# Patient Record
Sex: Male | Born: 1935 | Race: White | Hispanic: No | Marital: Single | Smoking: Former smoker
Health system: Southern US, Community
[De-identification: ages and names within clinical notes are randomized; demographics above are authoritative.]

## PROBLEM LIST (undated history)

## (undated) DIAGNOSIS — J392 Other diseases of pharynx: Secondary | ICD-10-CM

## (undated) DIAGNOSIS — I10 Essential (primary) hypertension: Secondary | ICD-10-CM

## (undated) HISTORY — PX: REPLACEMENT TOTAL KNEE: SUR1224

## (undated) HISTORY — DX: Other diseases of pharynx: J39.2

## (undated) HISTORY — PX: BACK SURGERY: SHX140

---

## 2006-12-14 ENCOUNTER — Other Ambulatory Visit: Payer: Self-pay

## 2006-12-14 ENCOUNTER — Emergency Department: Payer: Self-pay | Admitting: Emergency Medicine

## 2006-12-25 ENCOUNTER — Ambulatory Visit: Payer: Self-pay | Admitting: Internal Medicine

## 2008-11-27 IMAGING — CT CT ANGIO AORTA RUNOFF
1 series · 14 of 16 positions shown, 18 images · IV contrast (APPLIED)
Comparison: none

REASON FOR EXAM: dislocated left knee
COMMENTS:

[Series 5: angiorunoff 5.0 b30f · axial · 0.92mm/px · z∈[-478,+732]mm · 14 of 280 slices shown, 18 images]
[im 19/280  soft-tissue]
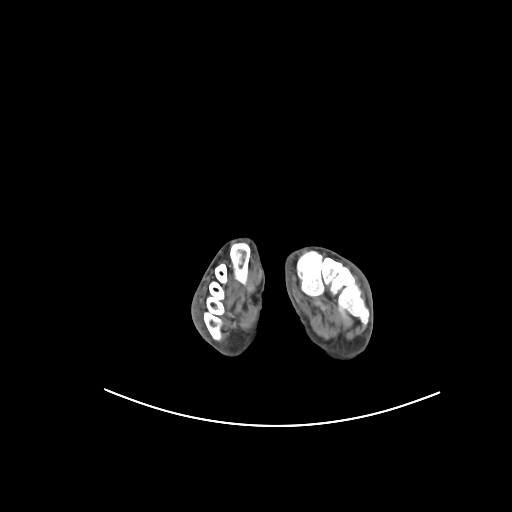
[im 19/280  bone]
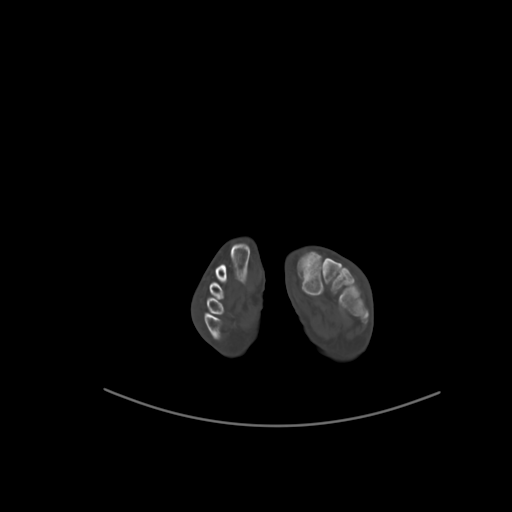
[im 38/280  soft-tissue]
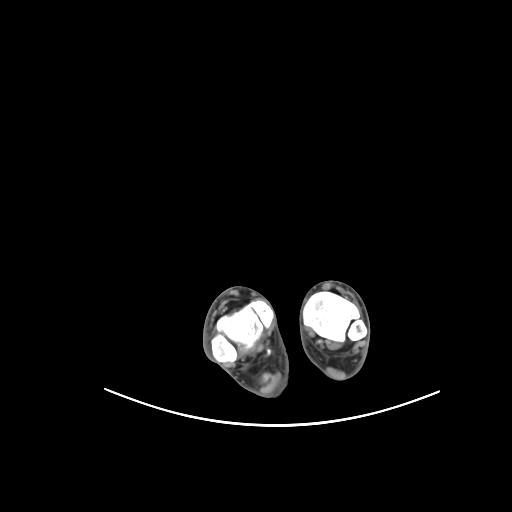
[im 56/280  soft-tissue]
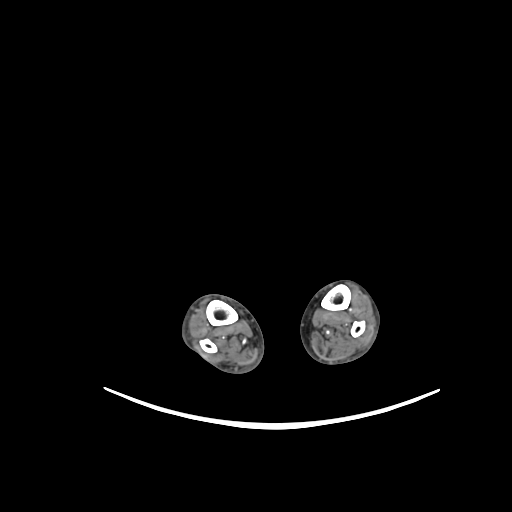
[im 75/280  soft-tissue]
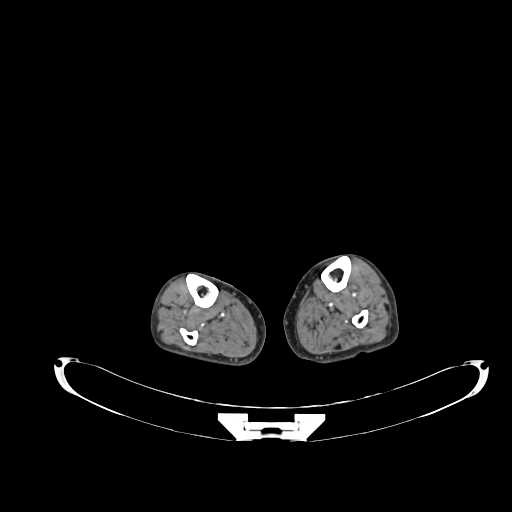
[im 94/280  soft-tissue]
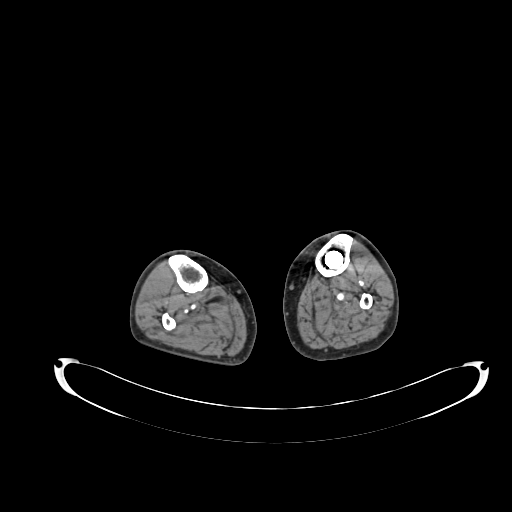
[im 94/280  bone]
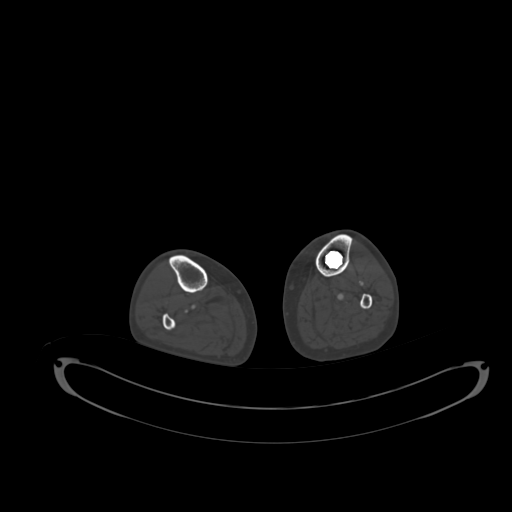
[im 112/280  soft-tissue]
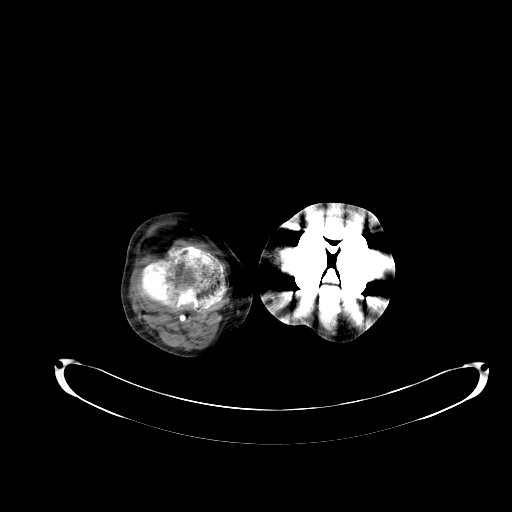
[im 131/280  soft-tissue]
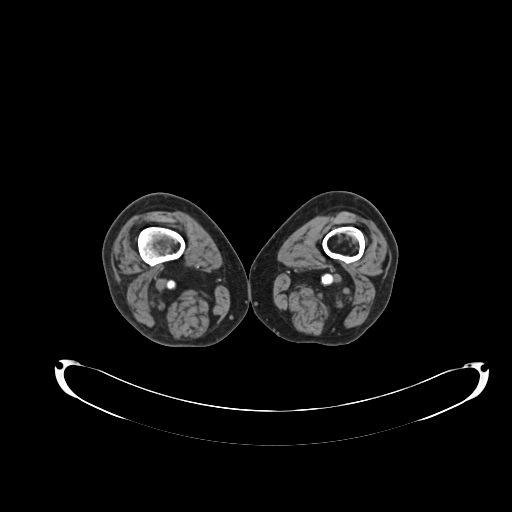
[im 149/280  soft-tissue]
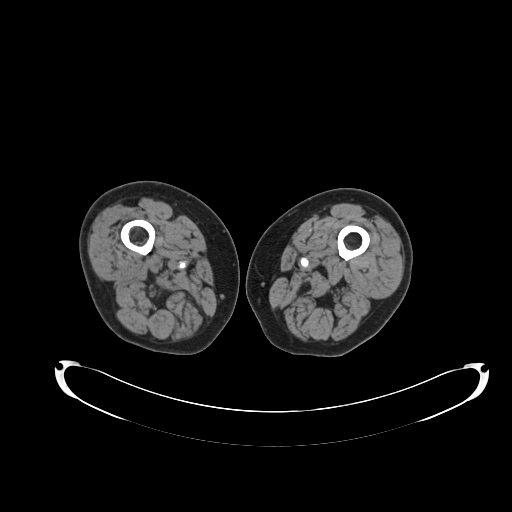
[im 168/280  soft-tissue]
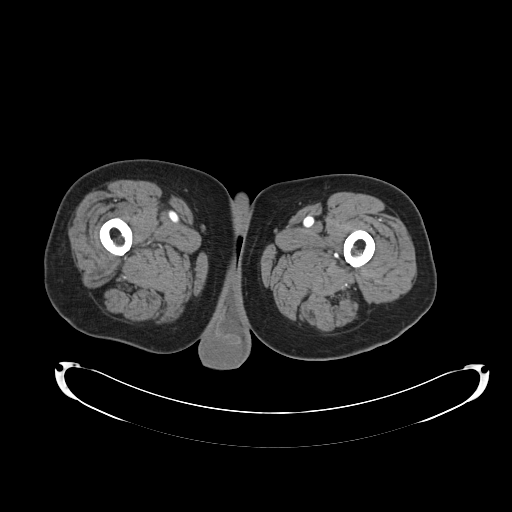
[im 168/280  bone]
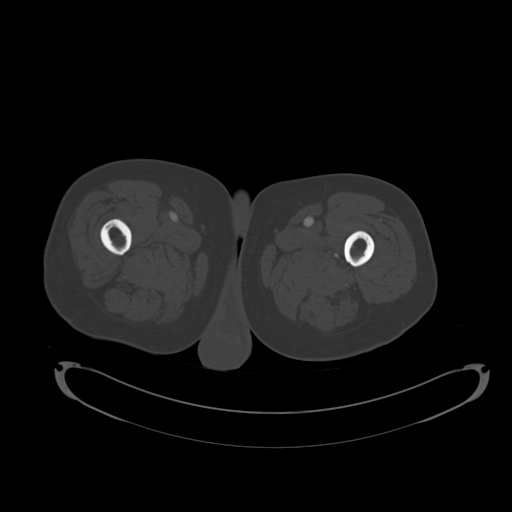
[im 187/280  soft-tissue]
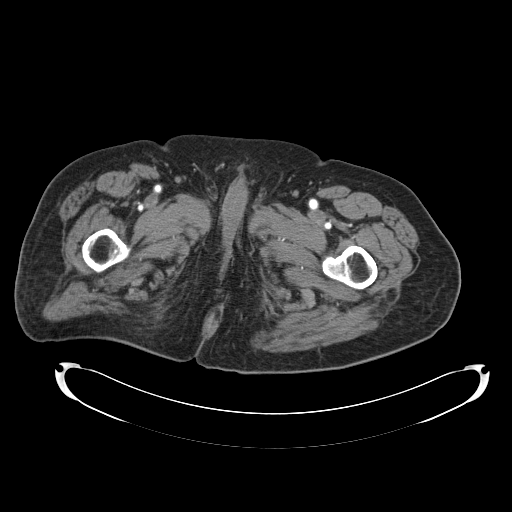
[im 205/280  soft-tissue]
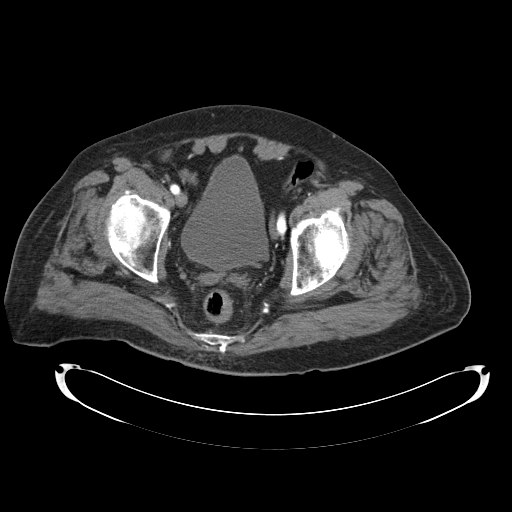
[im 224/280  soft-tissue]
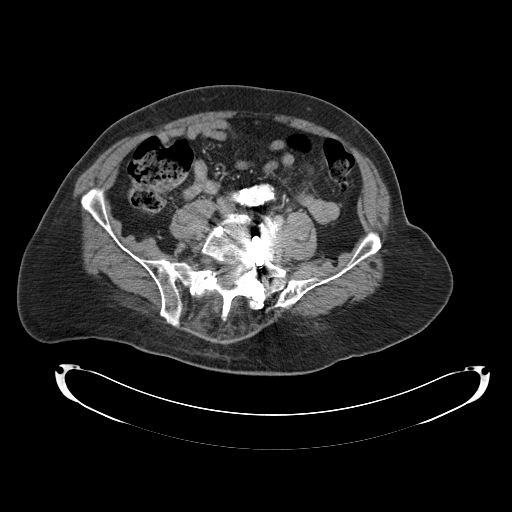
[im 242/280  soft-tissue]
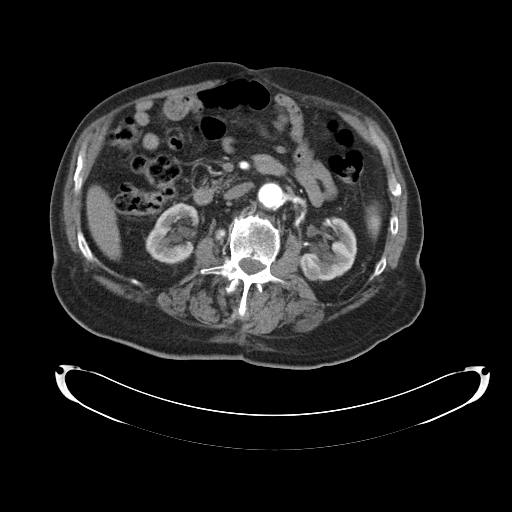
[im 242/280  bone]
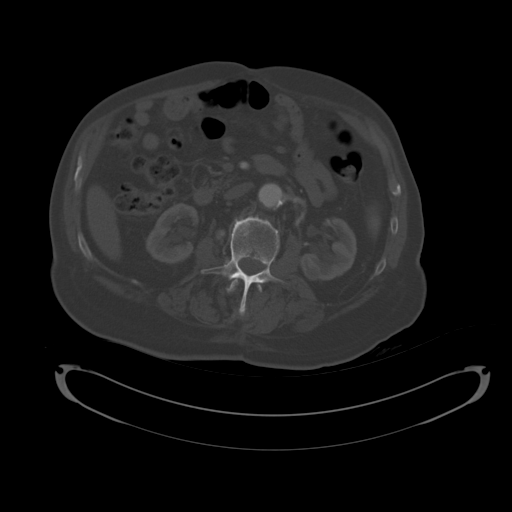
[im 261/280  soft-tissue]
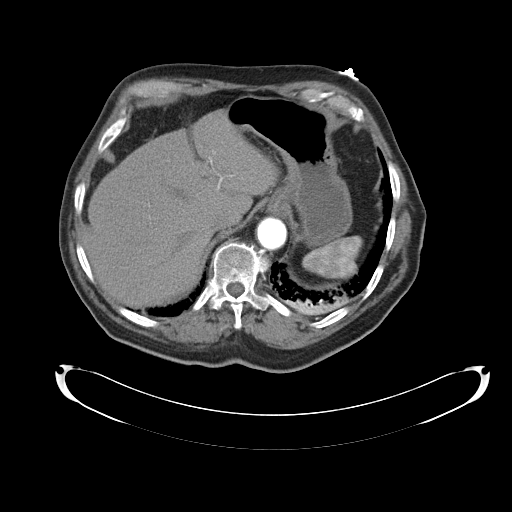

[14 of 16 positions shown; findings below may reference images not displayed]

PROCEDURE:     CT  - CT ANGIOGRAPHY AORTA RUNOFF  - December 14, 2006 [DATE]

RESULT:     CT angiogram was performed status post intravenous
administration of 125 ml of Isovue 370.  Included are axial source images,
coronal and sagittal source images, coronal and sagittal maximum intensity
projections, and 3D reconstructions.

CT angiogram of the liver, spleen, pancreas and adrenals are within normal
limits. Evaluation of the kidneys demonstrates what appear to be bilateral
peripelvic cysts.

There is no evidence of an abdominal aortic aneurysm. Coarse mural
calcifications are demonstrated within the aorta and iliac vessels. There is
no evidence of aneurysmal dilatation of the iliac vessels. The origins of
the iliac vessels are partially obscured secondary to metallic streak
artifact due to lumbar spine hardware. The vessels within the upper portions
of the lower extremities appear to be intact. There does appear to be
evidence of mild diffuse atherosclerotic disease. No focal outpouchings are
appreciated. Evaluation of the LEFT lower extremity demonstrates severe
streak artifact in the region of the knee which obscure visualization of the
majority of the popliteal vessel. Below this level the trifurcation vessels
demonstrate diffuse atherosclerotic disease though appear to be patent
except for the mid to distal portion of the peroneus artery secondary to
focal areas of complete stenosis.

Evaluation of the vessels within the RIGHT lower extremity demonstrate
patency of the internal and superficial femoral arteries though there is
diffuse moderate to severe atherosclerotic disease within the superficial
femoral artery. The popliteal vessel appears be patent. The trifurcation
vessels demonstrate diffuse atherosclerotic disease with areas of high-grade
stenosis within the trifurcation vessels.
IMPRESSION: 1.The visualized portions of the vessels demonstrate no evidence of contrast
extravasation or pseudoaneurysm. The LEFT popliteal arteries are not
visualized secondary to a large amount of steak artifact due to metallic
hardware.

Dr. Nurbazilah of the Emergency Room was informed of these findings via a
preliminary report.

## 2009-06-12 ENCOUNTER — Emergency Department: Payer: Self-pay | Admitting: Emergency Medicine

## 2009-11-08 ENCOUNTER — Ambulatory Visit: Payer: Self-pay | Admitting: Internal Medicine

## 2013-11-25 ENCOUNTER — Emergency Department: Payer: Self-pay | Admitting: Emergency Medicine

## 2013-11-25 LAB — URINALYSIS, COMPLETE
Bacteria: NONE SEEN
Bilirubin,UR: NEGATIVE
Blood: NEGATIVE
Glucose,UR: NEGATIVE mg/dL (ref 0–75)
KETONE: NEGATIVE
Leukocyte Esterase: NEGATIVE
Nitrite: NEGATIVE
PROTEIN: NEGATIVE
Ph: 5 (ref 4.5–8.0)
Specific Gravity: 1.024 (ref 1.003–1.030)
Squamous Epithelial: NONE SEEN

## 2013-11-25 LAB — CBC
HCT: 41.4 % (ref 40.0–52.0)
HGB: 13.6 g/dL (ref 13.0–18.0)
MCH: 29.6 pg (ref 26.0–34.0)
MCHC: 32.8 g/dL (ref 32.0–36.0)
MCV: 90 fL (ref 80–100)
Platelet: 189 10*3/uL (ref 150–440)
RBC: 4.59 10*6/uL (ref 4.40–5.90)
RDW: 14.1 % (ref 11.5–14.5)
WBC: 6.3 10*3/uL (ref 3.8–10.6)

## 2013-11-25 LAB — TROPONIN I
TROPONIN-I: 0.03 ng/mL
TROPONIN-I: 0.03 ng/mL

## 2013-11-25 LAB — COMPREHENSIVE METABOLIC PANEL
AST: 14 U/L — AB (ref 15–37)
Albumin: 3.5 g/dL (ref 3.4–5.0)
Alkaline Phosphatase: 93 U/L
Anion Gap: 4 — ABNORMAL LOW (ref 7–16)
BILIRUBIN TOTAL: 0.4 mg/dL (ref 0.2–1.0)
BUN: 24 mg/dL — AB (ref 7–18)
CALCIUM: 8.5 mg/dL (ref 8.5–10.1)
Chloride: 108 mmol/L — ABNORMAL HIGH (ref 98–107)
Co2: 28 mmol/L (ref 21–32)
Creatinine: 1.19 mg/dL (ref 0.60–1.30)
EGFR (African American): 60 — ABNORMAL LOW
EGFR (Non-African Amer.): 52 — ABNORMAL LOW
Glucose: 117 mg/dL — ABNORMAL HIGH (ref 65–99)
Osmolality: 284 (ref 275–301)
Potassium: 4.8 mmol/L (ref 3.5–5.1)
SGPT (ALT): 9 U/L — ABNORMAL LOW (ref 12–78)
Sodium: 140 mmol/L (ref 136–145)
Total Protein: 7.3 g/dL (ref 6.4–8.2)

## 2013-11-25 LAB — LIPASE, BLOOD: LIPASE: 85 U/L (ref 73–393)

## 2013-11-25 LAB — PHENYTOIN LEVEL, TOTAL: Dilantin: 1.7 ug/mL — ABNORMAL LOW (ref 10.0–20.0)

## 2013-11-26 ENCOUNTER — Emergency Department: Payer: Self-pay | Admitting: Emergency Medicine

## 2013-11-26 LAB — COMPREHENSIVE METABOLIC PANEL
ALBUMIN: 3.7 g/dL (ref 3.4–5.0)
ALT: 22 U/L (ref 12–78)
Alkaline Phosphatase: 101 U/L
Anion Gap: 6 — ABNORMAL LOW (ref 7–16)
BILIRUBIN TOTAL: 0.4 mg/dL (ref 0.2–1.0)
BUN: 21 mg/dL — AB (ref 7–18)
CO2: 26 mmol/L (ref 21–32)
Calcium, Total: 8.9 mg/dL (ref 8.5–10.1)
Chloride: 105 mmol/L (ref 98–107)
Creatinine: 0.99 mg/dL (ref 0.60–1.30)
EGFR (African American): >60
EGFR (Non-African Amer.): >60
GLUCOSE: 124 mg/dL — AB (ref 65–99)
OSMOLALITY: 278 (ref 275–301)
Potassium: 5 mmol/L (ref 3.5–5.1)
SGOT(AST): 8 U/L — ABNORMAL LOW (ref 15–37)
SODIUM: 137 mmol/L (ref 136–145)
Total Protein: 8 g/dL (ref 6.4–8.2)

## 2013-11-26 LAB — CBC
HCT: 41.1 % (ref 40.0–52.0)
HGB: 13.4 g/dL (ref 13.0–18.0)
MCH: 29.3 pg (ref 26.0–34.0)
MCHC: 32.6 g/dL (ref 32.0–36.0)
MCV: 90 fL (ref 80–100)
Platelet: 197 10*3/uL (ref 150–440)
RBC: 4.57 10*6/uL (ref 4.40–5.90)
RDW: 13.9 % (ref 11.5–14.5)
WBC: 6.9 10*3/uL (ref 3.8–10.6)

## 2013-11-26 LAB — LIPASE, BLOOD: Lipase: 73 U/L (ref 73–393)

## 2014-03-23 ENCOUNTER — Ambulatory Visit: Payer: Self-pay | Admitting: Oncology

## 2014-03-23 LAB — COMPREHENSIVE METABOLIC PANEL
ALT: 17 U/L
Albumin: 3.4 g/dL (ref 3.4–5.0)
Alkaline Phosphatase: 101 U/L
Anion Gap: 6 — ABNORMAL LOW (ref 7–16)
BILIRUBIN TOTAL: 0.4 mg/dL (ref 0.2–1.0)
BUN: 28 mg/dL — ABNORMAL HIGH (ref 7–18)
CO2: 28 mmol/L (ref 21–32)
CREATININE: 1.26 mg/dL (ref 0.60–1.30)
Calcium, Total: 8.9 mg/dL (ref 8.5–10.1)
Chloride: 105 mmol/L (ref 98–107)
EGFR (African American): >60
GFR CALC NON AF AMER: 57 — AB
GLUCOSE: 104 mg/dL — AB (ref 65–99)
Osmolality: 283 (ref 275–301)
Potassium: 5.6 mmol/L — ABNORMAL HIGH (ref 3.5–5.1)
SGOT(AST): 13 U/L — ABNORMAL LOW (ref 15–37)
SODIUM: 139 mmol/L (ref 136–145)
Total Protein: 6.9 g/dL (ref 6.4–8.2)

## 2014-03-23 LAB — CBC CANCER CENTER
Basophil #: 0.1 x10 3/mm (ref 0.0–0.1)
Basophil %: 0.8 %
EOS ABS: 0.2 x10 3/mm (ref 0.0–0.7)
Eosinophil %: 3.8 %
HCT: 39.2 % — AB (ref 40.0–52.0)
HGB: 12.9 g/dL — AB (ref 13.0–18.0)
LYMPHS PCT: 22.5 %
Lymphocyte #: 1.5 x10 3/mm (ref 1.0–3.6)
MCH: 29.3 pg (ref 26.0–34.0)
MCHC: 33 g/dL (ref 32.0–36.0)
MCV: 89 fL (ref 80–100)
MONO ABS: 0.6 x10 3/mm (ref 0.2–1.0)
MONOS PCT: 9.4 %
Neutrophil #: 4.1 x10 3/mm (ref 1.4–6.5)
Neutrophil %: 63.5 %
Platelet: 206 x10 3/mm (ref 150–440)
RBC: 4.4 10*6/uL (ref 4.40–5.90)
RDW: 14.6 % — ABNORMAL HIGH (ref 11.5–14.5)
WBC: 6.5 x10 3/mm (ref 3.8–10.6)

## 2014-03-23 LAB — LACTATE DEHYDROGENASE: LDH: 162 U/L (ref 85–241)

## 2014-04-05 ENCOUNTER — Ambulatory Visit: Payer: Self-pay | Admitting: Otolaryngology

## 2014-04-06 ENCOUNTER — Ambulatory Visit: Payer: Self-pay | Admitting: Oncology

## 2014-04-13 ENCOUNTER — Ambulatory Visit: Payer: Self-pay | Admitting: Oncology

## 2014-04-19 ENCOUNTER — Emergency Department: Payer: Self-pay | Admitting: Emergency Medicine

## 2014-04-19 LAB — CBC WITH DIFFERENTIAL/PLATELET
BASOS ABS: 0 10*3/uL (ref 0.0–0.1)
BASOS PCT: 0.7 %
EOS ABS: 0.1 10*3/uL (ref 0.0–0.7)
EOS PCT: 1.5 %
HCT: 39.4 % — ABNORMAL LOW (ref 40.0–52.0)
HGB: 12.9 g/dL — ABNORMAL LOW (ref 13.0–18.0)
LYMPHS ABS: 0.7 10*3/uL — AB (ref 1.0–3.6)
Lymphocyte %: 14.5 %
MCH: 29.7 pg (ref 26.0–34.0)
MCHC: 32.7 g/dL (ref 32.0–36.0)
MCV: 91 fL (ref 80–100)
MONOS PCT: 7.3 %
Monocyte #: 0.4 x10 3/mm (ref 0.2–1.0)
NEUTROS PCT: 76 %
Neutrophil #: 3.7 10*3/uL (ref 1.4–6.5)
PLATELETS: 186 10*3/uL (ref 150–440)
RBC: 4.33 10*6/uL — AB (ref 4.40–5.90)
RDW: 14.2 % (ref 11.5–14.5)
WBC: 4.9 10*3/uL (ref 3.8–10.6)

## 2014-04-19 LAB — COMPREHENSIVE METABOLIC PANEL
Albumin: 3.4 g/dL (ref 3.4–5.0)
Alkaline Phosphatase: 92 U/L
Anion Gap: 9 (ref 7–16)
BUN: 23 mg/dL — ABNORMAL HIGH (ref 7–18)
Bilirubin,Total: 0.4 mg/dL (ref 0.2–1.0)
CO2: 27 mmol/L (ref 21–32)
Calcium, Total: 8.3 mg/dL — ABNORMAL LOW (ref 8.5–10.1)
Chloride: 105 mmol/L (ref 98–107)
Creatinine: 1.1 mg/dL (ref 0.60–1.30)
EGFR (African American): >60
EGFR (Non-African Amer.): >60
Glucose: 107 mg/dL — ABNORMAL HIGH (ref 65–99)
Osmolality: 285 (ref 275–301)
POTASSIUM: 4.6 mmol/L (ref 3.5–5.1)
SGOT(AST): 16 U/L (ref 15–37)
SGPT (ALT): 12 U/L — ABNORMAL LOW
SODIUM: 141 mmol/L (ref 136–145)
TOTAL PROTEIN: 6.7 g/dL (ref 6.4–8.2)

## 2014-04-19 LAB — URINALYSIS, COMPLETE
Bacteria: NONE SEEN
Bilirubin,UR: NEGATIVE
Blood: NEGATIVE
Glucose,UR: NEGATIVE mg/dL (ref 0–75)
Hyaline Cast: 2
Ketone: NEGATIVE
Leukocyte Esterase: NEGATIVE
Nitrite: NEGATIVE
Ph: 5 (ref 4.5–8.0)
Protein: NEGATIVE
SPECIFIC GRAVITY: 1.011 (ref 1.003–1.030)
Squamous Epithelial: NONE SEEN

## 2014-04-19 LAB — TROPONIN I

## 2014-04-19 LAB — LIPASE, BLOOD: LIPASE: 38 U/L — AB (ref 73–393)

## 2014-10-08 ENCOUNTER — Other Ambulatory Visit: Payer: Self-pay | Admitting: *Deleted

## 2014-10-08 DIAGNOSIS — C8590 Non-Hodgkin lymphoma, unspecified, unspecified site: Secondary | ICD-10-CM

## 2014-10-12 ENCOUNTER — Inpatient Hospital Stay: Payer: Medicare Other | Attending: Oncology

## 2014-10-12 ENCOUNTER — Inpatient Hospital Stay (HOSPITAL_BASED_OUTPATIENT_CLINIC_OR_DEPARTMENT_OTHER): Payer: Medicare Other | Admitting: Oncology

## 2014-10-12 VITALS — BP 149/87 | HR 72 | Temp 97.0°F | Resp 18

## 2014-10-12 DIAGNOSIS — I10 Essential (primary) hypertension: Secondary | ICD-10-CM

## 2014-10-12 DIAGNOSIS — D72820 Lymphocytosis (symptomatic): Secondary | ICD-10-CM | POA: Diagnosis not present

## 2014-10-12 DIAGNOSIS — Z79899 Other long term (current) drug therapy: Secondary | ICD-10-CM | POA: Insufficient documentation

## 2014-10-12 DIAGNOSIS — R220 Localized swelling, mass and lump, head: Secondary | ICD-10-CM | POA: Insufficient documentation

## 2014-10-12 DIAGNOSIS — E785 Hyperlipidemia, unspecified: Secondary | ICD-10-CM | POA: Insufficient documentation

## 2014-10-12 DIAGNOSIS — M1990 Unspecified osteoarthritis, unspecified site: Secondary | ICD-10-CM | POA: Diagnosis not present

## 2014-10-12 DIAGNOSIS — R29898 Other symptoms and signs involving the musculoskeletal system: Secondary | ICD-10-CM | POA: Insufficient documentation

## 2014-10-12 DIAGNOSIS — J392 Other diseases of pharynx: Secondary | ICD-10-CM

## 2014-10-12 DIAGNOSIS — C8590 Non-Hodgkin lymphoma, unspecified, unspecified site: Secondary | ICD-10-CM

## 2014-10-12 LAB — CBC WITH DIFFERENTIAL/PLATELET
BASOS ABS: 0.1 10*3/uL (ref 0–0.1)
Basophils Relative: 1 %
EOS ABS: 0.1 10*3/uL (ref 0–0.7)
Eosinophils Relative: 2 %
HEMATOCRIT: 39.2 % — AB (ref 40.0–52.0)
HEMOGLOBIN: 12.8 g/dL — AB (ref 13.0–18.0)
LYMPHS ABS: 1.3 10*3/uL (ref 1.0–3.6)
Lymphocytes Relative: 21 %
MCH: 28.9 pg (ref 26.0–34.0)
MCHC: 32.6 g/dL (ref 32.0–36.0)
MCV: 88.8 fL (ref 80.0–100.0)
Monocytes Absolute: 0.6 10*3/uL (ref 0.2–1.0)
Monocytes Relative: 10 %
NEUTROS ABS: 4.2 10*3/uL (ref 1.4–6.5)
Neutrophils Relative %: 66 %
Platelets: 194 10*3/uL (ref 150–440)
RBC: 4.42 MIL/uL (ref 4.40–5.90)
RDW: 13.5 % (ref 11.5–14.5)
WBC: 6.2 10*3/uL (ref 3.8–10.6)

## 2014-10-12 LAB — COMPREHENSIVE METABOLIC PANEL
ALT: 11 U/L — AB (ref 17–63)
ANION GAP: 5 (ref 5–15)
AST: 16 U/L (ref 15–41)
Albumin: 3.7 g/dL (ref 3.5–5.0)
Alkaline Phosphatase: 85 U/L (ref 38–126)
BUN: 32 mg/dL — AB (ref 6–20)
CALCIUM: 8.2 mg/dL — AB (ref 8.9–10.3)
CO2: 26 mmol/L (ref 22–32)
Chloride: 105 mmol/L (ref 101–111)
Creatinine, Ser: 1.05 mg/dL (ref 0.61–1.24)
GFR calc non Af Amer: 58 mL/min — ABNORMAL LOW (ref >60)
Glucose, Bld: 101 mg/dL — ABNORMAL HIGH (ref 65–99)
Potassium: 5.4 mmol/L — ABNORMAL HIGH (ref 3.5–5.1)
SODIUM: 136 mmol/L (ref 135–145)
Total Bilirubin: 0.6 mg/dL (ref 0.3–1.2)
Total Protein: 6.8 g/dL (ref 6.5–8.1)

## 2014-10-12 LAB — SEDIMENTATION RATE: Sed Rate: 13 mm/hr (ref 0–20)

## 2014-10-13 ENCOUNTER — Encounter: Payer: Self-pay | Admitting: Oncology

## 2014-10-13 DIAGNOSIS — J392 Other diseases of pharynx: Secondary | ICD-10-CM

## 2014-10-13 HISTORY — DX: Other diseases of pharynx: J39.2

## 2014-10-13 NOTE — Progress Notes (Signed)
[place] @ [facility] [phone]  Fax:([phone] [place] OB: January 29, 1917  MR#: [id number]  GMW#:[id number]  Patient Care Team: [name], MD as PCP - General (Internal Medicine)  CHIEF COMPLAINT:  Chief Complaint  Patient presents with  . Follow-up    "swollen mass in my mouth"    Chief Complaint/Diagnosis:   1.right paramedian soft tissue mass, cystic in nature.  Initiate a needle biopsy revealed atypical lymphocytosis raising concerned regarding lymphoma (November, 2015) biopsy was done in Dr. Reola Mosher office 2.PET scan which has been donein November of 2015 was inconclusive  No flowsheet data found.  INTERVAL HISTORY: 81 year old gentleman came today further follow-up regarding nasopharyngeal mass.  Initial biopsy was inconclusive.  Patient does not have any difficulty swallowing.  Since last evaluation nothing has changed in terms of symptoms.  Patient is here for further evaluation.  No chills.  No fever.  No difficulty swallowing.  REVIEW OF SYSTEMS:   Gen. status: In wheelchair.  performance status HEENT: No difficulty swallowing No pain.  Lungs: No shortness of breath.  GI: Nausea no vomiting or diarrhea.  Lower extremity no edema.  Cardiac: No chest pain.  Patient had an emergency room visit since last evaluation.  GI: No nausea no vomiting or diarrhea GU: No dysuria hematuria musculoskeletal system no bony pain skin: No rash  As per HPI. Otherwise, a complete review of systems is negatve.  PAST MEDICAL HISTORY: Past Medical History  Diagnosis Date  . 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  FAMILY HISTORY  Allergies:  No Known Allergies:   Significant History/PMH:   high cholesterol:    htn:    seizures as a child:    ABD:    lumbar:    cervical:    left knee:   Preventive Screening:  Has patient had any of the following test? Colonscopy  Prostate Exam   Last  Colonoscopy: never   Last Prostate Exam: unknown   Smoking History: Smoking History smoked 1/2 pack/day for 10 years; quit 50 years ago.  PFSH: Family History: noncontributory  Social History: noncontributory, negative tobacco  Additional Past Medical and Surgical History: ARTHRITIS.  rIGHT LOWER EXTREMITY WEAKNESS.  pATIENT USING POWERED   WHEELCHAIR FOR AMBULATION      ADVANCED DIRECTIVES:    HEALTH MAINTENANCE: History  Substance Use Topics  . Smoking status: Not on file  . Smokeless tobacco: Not on file  . Alcohol Use: Not on file      No Known Allergies  Current Outpatient Prescriptions  Medication Sig Dispense Refill  . aspirin EC 81 MG tablet Take 81 mg by mouth daily.    . carbidopa-levodopa (SINEMET IR) 10-100 MG per tablet Take 1 tablet by mouth daily.    Marland Kitchen doxazosin (CARDURA) 2 MG tablet Take 2 mg by mouth daily.    Marland Kitchen lisinopril (PRINIVIL,ZESTRIL) 5 MG tablet Take 5 mg by mouth daily.    . nabumetone (RELAFEN) 500 MG tablet Take 500 mg by mouth daily.    Marland Kitchen oxybutynin (DITROPAN-XL) 5 MG 24 hr tablet Take 5 mg by mouth daily.    . phenytoin (DILANTIN) 100 MG ER capsule Take 100 mg by mouth daily.    Marland Kitchen triamcinolone ointment (KENALOG) 0.1 % Apply 1 application topically 2 (two) times daily.     No current facility-administered medications for this visit.    OBJECTIVE:  Filed Vitals:   10/12/14 1528  BP: 149/87  Pulse: 72  Temp: 97 F (36.1 C)  Resp: 18     There is no height or weight on file to calculate BMI.    ECOG FS:2 - Symptomatic, <50% confined to bed  PHYSICAL EXAM: Gen. status: Patient is in wheelchair.  Poor historian. HEENT: No difficulty swallowing.  Mass is increasing  not in size Lymphatic system: No palpable supraclavicular cervical axillary inguinal adenopathy Lungs were clear to auscultation and percussion, and with normal diaphragmatic excursion. No wheezes or rales were noted.  Cardiac exam revealed the PMI to be normally situated  and sized. The rhythm was regular and no extrasystoles were noted during several minutes of auscultation. The first and second heart sounds were normal and physiologic splitting of the second heart sound was noted. There were no murmurs, rubs, clicks, or gallopsAbdominal exam revealed normal bowel sounds. The abdomen was soft, non-tender, and without masses, organomegaly, or appreciable enlargement of the abdominal aortaExamination of the skin revealed no evidence of significant rashes, suspicious appearing nevi or other concerning lesionsNeurologically, the patient was awake, alert, and oriented to person, place and time. There were no obvious focal neurologic abnormalities.   LAB RESULTS:  Appointment on 10/12/2014  Component Date Value Ref Range Status  . WBC 10/12/2014 6.2  3.8 - 10.6 K/uL Final  . RBC 10/12/2014 4.42  4.40 - 5.90 MIL/uL Final  . Hemoglobin 10/12/2014 12.8* 13.0 - 18.0 g/dL Final  . HCT 10/12/2014 39.2* 40.0 - 52.0 % Final  . MCV 10/12/2014 88.8  80.0 - 100.0 fL Final  . MCH 10/12/2014 28.9  26.0 - 34.0 pg Final  . MCHC 10/12/2014 32.6  32.0 - 36.0 g/dL Final  . RDW 10/12/2014 13.5  11.5 - 14.5 % Final  . Platelets 10/12/2014 194  150 - 440 K/uL Final  . Neutrophils Relative % 10/12/2014 66   Final  . Neutro Abs 10/12/2014 4.2  1.4 - 6.5 K/uL Final  . Lymphocytes Relative 10/12/2014 21   Final  . Lymphs Abs 10/12/2014 1.3  1.0 - 3.6 K/uL Final  . Monocytes Relative 10/12/2014 10   Final  . Monocytes Absolute 10/12/2014 0.6  0.2 - 1.0 K/uL Final  . Eosinophils Relative 10/12/2014 2   Final  . Eosinophils Absolute 10/12/2014 0.1  0 - 0.7 K/uL Final  . Basophils Relative 10/12/2014 1   Final  . Basophils Absolute 10/12/2014 0.1  0 - 0.1 K/uL Final  . Sodium 10/12/2014 136  135 - 145 mmol/L Final  . Potassium 10/12/2014 5.4* 3.5 - 5.1 mmol/L Final  . Chloride 10/12/2014 105  101 - 111 mmol/L Final  . CO2 10/12/2014 26  22 - 32 mmol/L Final  . Glucose, Bld 10/12/2014  101* 65 - 99 mg/dL Final  . BUN 10/12/2014 32* 6 - 20 mg/dL Final  . Creatinine, Ser 10/12/2014 1.05  0.61 - 1.24 mg/dL Final  . Calcium 10/12/2014 8.2* 8.9 - 10.3 mg/dL Final  . Total Protein 10/12/2014 6.8  6.5 - 8.1 g/dL Final  . Albumin 10/12/2014 3.7  3.5 - 5.0 g/dL Final  . AST 10/12/2014 16  15 - 41 U/L Final  . ALT 10/12/2014 11* 17 - 63 U/L Final  . Alkaline Phosphatase 10/12/2014 85  38 - 126 U/L Final  . Total Bilirubin 10/12/2014 0.6  0.3 - 1.2 mg/dL Final  . GFR calc non Af Amer 10/12/2014 58* >60 mL/min Final  . GFR calc Af Amer 10/12/2014 >60  >60 mL/min Final   Comment: (NOTE) The eGFR  has been calculated using the CKD EPI equation. This calculation has not been validated in all clinical situations. eGFR's persistently <60 mL/min signify possible Chronic Kidney Disease.   . Anion gap 10/12/2014 5  5 - 15 Final  . Sed Rate 10/12/2014 13  0 - 20 mm/hr Final     STUDIES: No results found.  ASSESSMENT: 81 year old gentleman with nasopharyngeal mass biopsy was inconclusive.  Patient does not want any aggressive procedure since last evaluation masses and not increased in size  MEDICAL DECISION MAKING:  Genu observation in one year or before the patient continues to drop any major symptoms All lab data has been reviewed.  Patient expressed understanding and was in agreement with this plan. He also understands that He can call clinic at any time with any questions, concerns, or complaints.    No matching staging information was found for the patient.  Forest Gleason, MD   10/13/2014 9:11 PM

## 2015-03-13 ENCOUNTER — Inpatient Hospital Stay
Admission: EM | Admit: 2015-03-13 | Discharge: 2015-03-16 | DRG: 282 | Disposition: A | Discharge status: Skilled Nursing Facility | Payer: Medicare Other | Attending: Internal Medicine | Admitting: Internal Medicine

## 2015-03-13 ENCOUNTER — Emergency Department: Payer: Medicare Other

## 2015-03-13 ENCOUNTER — Encounter: Payer: Self-pay | Admitting: Emergency Medicine

## 2015-03-13 DIAGNOSIS — I214 Non-ST elevation (NSTEMI) myocardial infarction: Principal | ICD-10-CM | POA: Diagnosis present

## 2015-03-13 DIAGNOSIS — R9431 Abnormal electrocardiogram [ECG] [EKG]: Secondary | ICD-10-CM

## 2015-03-13 DIAGNOSIS — Z66 Do not resuscitate: Secondary | ICD-10-CM | POA: Diagnosis present

## 2015-03-13 DIAGNOSIS — Z79899 Other long term (current) drug therapy: Secondary | ICD-10-CM

## 2015-03-13 DIAGNOSIS — I10 Essential (primary) hypertension: Secondary | ICD-10-CM | POA: Diagnosis present

## 2015-03-13 DIAGNOSIS — R079 Chest pain, unspecified: Secondary | ICD-10-CM | POA: Diagnosis present

## 2015-03-13 DIAGNOSIS — H9190 Unspecified hearing loss, unspecified ear: Secondary | ICD-10-CM | POA: Diagnosis present

## 2015-03-13 DIAGNOSIS — G2581 Restless legs syndrome: Secondary | ICD-10-CM | POA: Diagnosis present

## 2015-03-13 DIAGNOSIS — I4510 Unspecified right bundle-branch block: Secondary | ICD-10-CM | POA: Diagnosis present

## 2015-03-13 DIAGNOSIS — Z993 Dependence on wheelchair: Secondary | ICD-10-CM | POA: Diagnosis not present

## 2015-03-13 DIAGNOSIS — E785 Hyperlipidemia, unspecified: Secondary | ICD-10-CM | POA: Diagnosis present

## 2015-03-13 DIAGNOSIS — Z87891 Personal history of nicotine dependence: Secondary | ICD-10-CM

## 2015-03-13 DIAGNOSIS — G20 Parkinson's disease: Secondary | ICD-10-CM | POA: Diagnosis present

## 2015-03-13 DIAGNOSIS — Z7982 Long term (current) use of aspirin: Secondary | ICD-10-CM | POA: Diagnosis not present

## 2015-03-13 HISTORY — DX: Essential (primary) hypertension: I10

## 2015-03-13 LAB — CBC
HCT: 34.6 % — ABNORMAL LOW (ref 40.0–52.0)
HEMATOCRIT: 38.1 % — AB (ref 40.0–52.0)
HEMOGLOBIN: 11.6 g/dL — AB (ref 13.0–18.0)
Hemoglobin: 12.4 g/dL — ABNORMAL LOW (ref 13.0–18.0)
MCH: 29 pg (ref 26.0–34.0)
MCH: 29.5 pg (ref 26.0–34.0)
MCHC: 32.7 g/dL (ref 32.0–36.0)
MCHC: 33.7 g/dL (ref 32.0–36.0)
MCV: 88.7 fL (ref 80.0–100.0)
MCV: 87.5 fL (ref 80.0–100.0)
PLATELETS: 209 10*3/uL (ref 150–440)
Platelets: 213 10*3/uL (ref 150–440)
RBC: 4.29 MIL/uL — ABNORMAL LOW (ref 4.40–5.90)
RBC: 3.95 MIL/uL — AB (ref 4.40–5.90)
RDW: 13.6 % (ref 11.5–14.5)
RDW: 13.9 % (ref 11.5–14.5)
WBC: 6.9 10*3/uL (ref 3.8–10.6)
WBC: 6.8 10*3/uL (ref 3.8–10.6)

## 2015-03-13 LAB — BASIC METABOLIC PANEL
ANION GAP: 6 (ref 5–15)
Anion gap: 8 (ref 5–15)
BUN: 28 mg/dL — AB (ref 6–20)
BUN: 25 mg/dL — ABNORMAL HIGH (ref 6–20)
CHLORIDE: 107 mmol/L (ref 101–111)
CO2: 25 mmol/L (ref 22–32)
CO2: 26 mmol/L (ref 22–32)
CREATININE: 1.12 mg/dL (ref 0.61–1.24)
CREATININE: 0.92 mg/dL (ref 0.61–1.24)
Calcium: 8.9 mg/dL (ref 8.9–10.3)
Calcium: 8.6 mg/dL — ABNORMAL LOW (ref 8.9–10.3)
Chloride: 106 mmol/L (ref 101–111)
GFR calc non Af Amer: >60 mL/min (ref >60)
GFR, EST NON AFRICAN AMERICAN: 53 mL/min — AB (ref >60)
Glucose, Bld: 130 mg/dL — ABNORMAL HIGH (ref 65–99)
Glucose, Bld: 105 mg/dL — ABNORMAL HIGH (ref 65–99)
POTASSIUM: 5.3 mmol/L — AB (ref 3.5–5.1)
Potassium: 4.9 mmol/L (ref 3.5–5.1)
SODIUM: 139 mmol/L (ref 135–145)
Sodium: 139 mmol/L (ref 135–145)

## 2015-03-13 LAB — LIPID PANEL
CHOL/HDL RATIO: 4 ratio
Cholesterol: 183 mg/dL (ref 0–200)
HDL: 46 mg/dL (ref >40)
LDL CALC: 121 mg/dL — AB (ref 0–99)
Triglycerides: 79 mg/dL (ref <150)
VLDL: 16 mg/dL (ref 0–40)

## 2015-03-13 LAB — TROPONIN I
Troponin I: 0.03 ng/mL (ref <0.031)
Troponin I: 1.2 ng/mL — ABNORMAL HIGH (ref <0.031)
Troponin I: 0.94 ng/mL — ABNORMAL HIGH (ref <0.031)

## 2015-03-13 LAB — MRSA PCR SCREENING: MRSA BY PCR: NEGATIVE

## 2015-03-13 MED ORDER — ASPIRIN EC 81 MG PO TBEC
81.0000 mg | DELAYED_RELEASE_TABLET | Freq: Every day | ORAL | Status: DC
  Administered 2015-03-13 – 2015-03-16 (×4): 81 mg via ORAL
  Filled 2015-03-13 (×4): qty 1

## 2015-03-13 MED ORDER — ENOXAPARIN SODIUM 40 MG/0.4ML ~~LOC~~ SOLN
40.0000 mg | Freq: Every day | SUBCUTANEOUS | Status: DC
  Filled 2015-03-13: qty 0.4

## 2015-03-13 MED ORDER — ACETAMINOPHEN 650 MG RE SUPP: 650.0000 mg | Freq: Four times a day (QID) | RECTAL | Status: DC | PRN

## 2015-03-13 MED ORDER — ACETAMINOPHEN 325 MG PO TABS: 650.0000 mg | ORAL_TABLET | Freq: Four times a day (QID) | ORAL | Status: DC | PRN

## 2015-03-13 MED ORDER — METOPROLOL TARTRATE 1 MG/ML IV SOLN: 5.0000 mg | Freq: Four times a day (QID) | INTRAVENOUS | Status: DC | PRN

## 2015-03-13 MED ORDER — NABUMETONE 500 MG PO TABS
500.0000 mg | ORAL_TABLET | Freq: Every day | ORAL | Status: DC
Start: 2015-03-13 — End: 2015-03-16
  Administered 2015-03-13 – 2015-03-16 (×4): 500 mg via ORAL
  Filled 2015-03-13 (×4): qty 1

## 2015-03-13 MED ORDER — CLOPIDOGREL BISULFATE 75 MG PO TABS
75.0000 mg | ORAL_TABLET | Freq: Every day | ORAL | Status: DC
  Administered 2015-03-13 – 2015-03-16 (×4): 75 mg via ORAL
  Filled 2015-03-13 (×4): qty 1

## 2015-03-13 MED ORDER — NITROGLYCERIN 2 % TD OINT
TOPICAL_OINTMENT | TRANSDERMAL | Status: AC
  Administered 2015-03-13: 1 [in_us] via TOPICAL
  Filled 2015-03-13: qty 1

## 2015-03-13 MED ORDER — NITROGLYCERIN 2 % TD OINT
1.0000 [in_us] | TOPICAL_OINTMENT | Freq: Once | TRANSDERMAL | Status: AC
  Administered 2015-03-13: 1 [in_us] via TOPICAL

## 2015-03-13 MED ORDER — CARBIDOPA-LEVODOPA 10-100 MG PO TABS
1.0000 | ORAL_TABLET | Freq: Every day | ORAL | Status: DC
  Administered 2015-03-13 – 2015-03-15 (×3): 1 via ORAL
  Filled 2015-03-13 (×3): qty 1

## 2015-03-13 MED ORDER — ALUM & MAG HYDROXIDE-SIMETH 200-200-20 MG/5ML PO SUSP: 30.0000 mL | Freq: Four times a day (QID) | ORAL | Status: DC | PRN

## 2015-03-13 MED ORDER — OXYBUTYNIN CHLORIDE ER 5 MG PO TB24
5.0000 mg | ORAL_TABLET | Freq: Every day | ORAL | Status: DC
Start: 2015-03-13 — End: 2015-03-16
  Administered 2015-03-13 – 2015-03-16 (×4): 5 mg via ORAL
  Filled 2015-03-13 (×4): qty 1

## 2015-03-13 MED ORDER — ENOXAPARIN SODIUM 80 MG/0.8ML ~~LOC~~ SOLN
1.0000 mg/kg | Freq: Two times a day (BID) | SUBCUTANEOUS | Status: DC
  Administered 2015-03-13 – 2015-03-16 (×7): 75 mg via SUBCUTANEOUS
  Filled 2015-03-13 (×7): qty 0.8

## 2015-03-13 MED ORDER — DOXAZOSIN MESYLATE 4 MG PO TABS
2.0000 mg | ORAL_TABLET | Freq: Every day | ORAL | Status: DC
Start: 2015-03-13 — End: 2015-03-16
  Administered 2015-03-13 – 2015-03-15 (×3): 2 mg via ORAL
  Filled 2015-03-13 (×3): qty 1

## 2015-03-13 MED ORDER — PHENYTOIN SODIUM EXTENDED 100 MG PO CAPS
100.0000 mg | ORAL_CAPSULE | Freq: Every day | ORAL | Status: DC
  Administered 2015-03-13 – 2015-03-16 (×4): 100 mg via ORAL
  Filled 2015-03-13 (×4): qty 1

## 2015-03-13 MED ORDER — NITROGLYCERIN 0.4 MG SL SUBL: 0.4000 mg | SUBLINGUAL_TABLET | SUBLINGUAL | Status: DC | PRN

## 2015-03-13 MED ORDER — ONDANSETRON HCL 4 MG/2ML IJ SOLN: 4.0000 mg | Freq: Four times a day (QID) | INTRAMUSCULAR | Status: DC | PRN

## 2015-03-13 MED ORDER — ONDANSETRON HCL 4 MG PO TABS: 4.0000 mg | ORAL_TABLET | Freq: Four times a day (QID) | ORAL | Status: DC | PRN

## 2015-03-13 MED ORDER — LISINOPRIL 5 MG PO TABS
5.0000 mg | ORAL_TABLET | Freq: Every day | ORAL | Status: DC
  Administered 2015-03-13 – 2015-03-16 (×3): 5 mg via ORAL
  Filled 2015-03-13 (×4): qty 1

## 2015-03-13 MED ORDER — SODIUM CHLORIDE 0.9 % IJ SOLN
3.0000 mL | Freq: Two times a day (BID) | INTRAMUSCULAR | Status: DC
  Administered 2015-03-13 – 2015-03-16 (×7): 3 mL via INTRAVENOUS

## 2015-03-13 MED ORDER — ISOSORBIDE MONONITRATE ER 30 MG PO TB24
30.0000 mg | ORAL_TABLET | Freq: Every day | ORAL | Status: DC
  Administered 2015-03-14 – 2015-03-16 (×3): 30 mg via ORAL
  Filled 2015-03-13 (×3): qty 1

## 2015-03-13 NOTE — Progress Notes (Signed)
Patient A&O. Very HOH and very weak. Maximum 2plus assist to get from stretcher to bed. NO pain at this time. Skin verified with Lisa,RN. No open areas. Foam patch placed on reddened areas of buttocks. On RA. Patient resting well at this time.

## 2015-03-13 NOTE — Progress Notes (Signed)
Patient has been resting in bed. Son was at bedside. No complaints to report.

## 2015-03-13 NOTE — H&P (Signed)
PCP:   HANDE,VISHWANATH, MD   Chief Complaint:  Chest/upper body discomfort  HPI: This is a 81 year old gentleman who was visiting with his son when today he complained of chest pains and generalized upper body discomfort. Radiates up to the jaws and under bilateral arms. He was moaning and wanted to come to the ER. He reports abdominal discomfort. In the ER he received nitropaste and states that he felt better after. His EKG shows new right bundle-branch block and ST segment depressions. He has no cardiac history. Patient is very hard of hearing. History provided by the patient and his son who is present at bedside.  Review of Systems:  The patient denies anorexia, fever, weight loss, vision loss, decreased hearing, hoarseness, syncope, dyspnea on exertion, peripheral edema, balance deficits, hemoptysis, abdominal pain, melena, hematochezia, severe indigestion/heartburn, hematuria, incontinence, genital sores, muscle weakness, suspicious skin lesions, transient blindness, difficulty walking, depression, unusual weight change, abnormal bleeding, enlarged lymph nodes, angioedema, and breast masses.  Past Medical History: Past Medical History  Diagnosis Date  . 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 Hypertension    History reviewed. Back, TKR  Medications: Prior to Admission medications   Medication Sig Start Date End Date Taking? Authorizing Provider  aspirin EC 81 MG tablet Take 81 mg by mouth daily.   Yes Historical Provider, MD  carbidopa-levodopa (SINEMET IR) 10-100 MG per tablet Take 1 tablet by mouth daily. 04/30/14  Yes Historical Provider, MD  Cyanocobalamin (RA VITAMIN B-12 TR) 1000 MCG TBCR Take 1 tablet by mouth daily.   Yes Historical Provider, MD  doxazosin (CARDURA) 2 MG tablet Take 2 mg by mouth daily. 05/17/14  Yes Historical Provider, MD  lisinopril (PRINIVIL,ZESTRIL) 5 MG tablet Take 5 mg by mouth daily. 05/28/14  Yes Historical Provider, MD  nabumetone (RELAFEN) 500 MG tablet Take  500 mg by mouth daily. 08/31/14  Yes Historical Provider, MD  oxybutynin (DITROPAN-XL) 5 MG 24 hr tablet Take 5 mg by mouth daily. 05/28/14  Yes Historical Provider, MD  phenytoin (DILANTIN) 100 MG ER capsule Take 100 mg by mouth daily.   Yes Historical Provider, MD  triamcinolone ointment (KENALOG) 0.1 % Apply 1 application topically 2 (two) times daily. 04/15/14  Yes Historical Provider, MD    Allergies:  No Known Allergies  Social History:  reports that he has quit smoking. He does not have any smokeless tobacco history on file. He reports that he does not drink alcohol. His drug history is not on file.he resides at [facility] independent [facility]. He is wheelchair bound. He eats a regular diet. His son Arda Keadle is his POA. Phone number [phone].  Family History: None  Physical Exam: Filed Vitals:   03/13/15 0201 03/13/15 0230 03/13/15 0300  BP: 182/94 181/89 163/71  Pulse: 105 91 78  Temp: 97.6 F (36.4 C)    TempSrc: Oral    Resp: 22 20 19   Height: 5\' 8"  (1.727 m)    Weight: 81.647 kg (180 lb)    SpO2: 94% 94% 95%    General:  Alert and oriented times three, well developed and nourished, no acute distress Eyes: PERRLA, pink conjunctiva, no scleral icterus ENT: Moist oral mucosa, neck supple, no thyromegaly Lungs: clear to ascultation, no wheeze, no crackles, no use of accessory muscles Cardiovascular: regular rate and rhythm, no regurgitation, no gallops, no murmurs. No carotid bruits, no JVD Abdomen: soft, positive BS, non-tender, non-distended, no organomegaly, not an acute abdomen GU: not examined Neuro: CN II - XII grossly intact,  sensation intact Musculoskeletal: Decreased strength globally Skin: no rash, no subcutaneous crepitation, no decubitus Psych: appropriate patient   Labs on Admission:   Recent Labs  03/13/15 0229  NA 139  K 5.3*  CL 106  CO2 25  GLUCOSE 130*  BUN 28*  CREATININE 1.12  CALCIUM 8.9   No results for input(s): AST, ALT,  ALKPHOS, BILITOT, PROT, ALBUMIN in the last 72 hours. No results for input(s): LIPASE, AMYLASE in the last 72 hours.  Recent Labs  03/13/15 0229  WBC 6.9  HGB 12.4*  HCT 38.1*  MCV 88.7  PLT 209    Recent Labs  03/13/15 0229  TROPONINI 0.03   Invalid input(s): POCBNP No results for input(s): DDIMER in the last 72 hours. No results for input(s): HGBA1C in the last 72 hours. No results for input(s): CHOL, HDL, LDLCALC, TRIG, CHOLHDL, LDLDIRECT in the last 72 hours. No results for input(s): TSH, T4TOTAL, T3FREE, THYROIDAB in the last 72 hours.  Invalid input(s): FREET3 No results for input(s): VITAMINB12, FOLATE, FERRITIN, TIBC, IRON, RETICCTPCT in the last 72 hours.  Micro Results: No results found for this or any previous visit (from the past 240 hour(s)).   Radiological Exams on Admission: Dg Chest Port 1 View  03/13/2015  CLINICAL DATA:  Acute onset of generalized chest, abdominal and arm pain. Initial encounter. EXAM: PORTABLE CHEST 1 VIEW COMPARISON:  Chest radiograph from 04/19/2014 FINDINGS: The lungs are well-aerated. Mild bibasilar atelectasis is noted. There is no evidence of pleural effusion or pneumothorax. The cardiomediastinal silhouette is mildly enlarged. No acute osseous abnormalities are seen. There is stable prominence of the right paratracheal soft tissues, reflecting normal vasculature. IMPRESSION: Mild bibasilar atelectasis noted.  Mild cardiomegaly. Electronically Signed   By: Garald Balding M.D.   On: 03/13/2015 02:48    Assessment/Plan Present on Admission:  . HTN (hypertension) . Chest pain -Bring in for 23 hour observation  -Cycle cardiac enzymes  -Lipid panel in a.m., 2-D echo  -Consult cardiology  -Nitroglycerin when necessary, Lopressor when necessary -Family were given patient's age will defer to cardiology for any treatment plan. Will likely be medical management  . HLD (hyperlipidemia) restless leg syndrome -Stable, home medications  resumed    Giselle Brutus 03/13/2015, 4:14 AM

## 2015-03-13 NOTE — ED Provider Notes (Signed)
[facility] Emergency Department Provider Note  ____________________________________________  Time seen: Approximately 3:31 AM  I have reviewed the triage vital signs and the nursing notes.   HISTORY  Chief Complaint Chest Pain and Arm Pain    HPI Jay Hughes is a 80 y.o. male patient was in bed at night when he woke up complaining of feeling cold like I'm going to die" patient was having chest pain which sounds like it was pressure-like in the middle of his chest radiating into both arms and also in the epigastric area. Patient is not short of breath and did not have any sweating or nausea with it. Patient's son is with him reports he's had this several times in the past and everything is always been negative. In the emergency room patient reports the pain is somewhat better and then it resolves after nitro paste was placed. The pain apparently was severe. Patient reports nothing seemed to make it better deep breathing movement activity and did not have any change on it.   Past Medical History  Diagnosis Date  . 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 Hypertension     Patient Active Problem List   Diagnosis Date Noted  . 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 Arthritis 10/12/2014  . BP (high blood pressure) 10/12/2014  . HLD (hyperlipidemia) 10/12/2014  . Leg weakness 10/12/2014    History reviewed. No pertinent past surgical history.  Current Outpatient Rx  Name  Route  Sig  Dispense  Refill  . aspirin EC 81 MG tablet   Oral   Take 81 mg by mouth daily.         . carbidopa-levodopa (SINEMET IR) 10-100 MG per tablet   Oral   Take 1 tablet by mouth daily.         . Cyanocobalamin (RA VITAMIN B-12 TR) 1000 MCG TBCR   Oral   Take 1 tablet by mouth daily.         Marland Kitchen doxazosin (CARDURA) 2 MG tablet   Oral   Take 2 mg by mouth daily.         Marland Kitchen lisinopril (PRINIVIL,ZESTRIL) 5 MG tablet   Oral   Take 5 mg by mouth daily.         . nabumetone  (RELAFEN) 500 MG tablet   Oral   Take 500 mg by mouth daily.         Marland Kitchen oxybutynin (DITROPAN-XL) 5 MG 24 hr tablet   Oral   Take 5 mg by mouth daily.         . phenytoin (DILANTIN) 100 MG ER capsule   Oral   Take 100 mg by mouth daily.         Marland Kitchen triamcinolone ointment (KENALOG) 0.1 %   Topical   Apply 1 application topically 2 (two) times daily.           Allergies Review of patient's allergies indicates no known allergies.  No family history on file.  Social History Social History  Substance Use Topics  . Smoking status: Former Research scientist (life sciences)  . Smokeless tobacco: None  . Alcohol Use: No    Review of Systems Constitutional: No fever/chills Eyes: No visual changes. ENT: No sore throat. Cardiovascular: See history of present illness Respiratory: Denies shortness of breath. Gastrointestinal: No abdominal pain.  No nausea, no vomiting.  No diarrhea.  No constipation. Genitourinary: Negative for dysuria. Musculoskeletal: Negative for back pain. Skin: Negative for rash.  10-point ROS otherwise negative.  ____________________________________________  PHYSICAL EXAM:  VITAL SIGNS: ED Triage Vitals  Enc Vitals Group     BP 03/13/15 0201 182/94 mmHg     Pulse Rate 03/13/15 0201 105     Resp 03/13/15 0201 22     Temp 03/13/15 0201 97.6 F (36.4 C)     Temp Source 03/13/15 0201 Oral     SpO2 03/13/15 0201 94 %     Weight 03/13/15 0201 180 lb (81.647 kg)     Height 03/13/15 0201 5\' 8"  (1.727 m)     Head Cir --      Peak Flow --      Pain Score 03/13/15 0154 10     Pain Loc --      Pain Edu? --      Excl. in [place]? --    Constitutional: Alert and oriented. Well appearing and in no acute distress. Patient is very deaf and has a hearing aid in his right ear Eyes: Conjunctivae are normal. PERRL. EOMI. Head: Atraumatic. Nose: No congestion/rhinnorhea. Mouth/Throat: Mucous membranes are moist.  Oropharynx non-erythematous. Neck: No stridor. Cardiovascular: Normal  rate, regular rhythm. Grossly normal heart sounds.  Good peripheral circulation. Respiratory: Normal respiratory effort.  No retractions. Lungs CTAB. Gastrointestinal: Soft and nontender. No distention. No abdominal bruits. No CVA tenderness. Musculoskeletal: No lower extremity tenderness nor edema.  No joint effusions. Neurologic:  Normal speech and language. No gross focal neurologic deficits are appreciated. No gait instability. Skin:  Skin is warm, dry and intact. No rash noted. al.  ____________________________________________   LABS (all labs ordered are listed, but only abnormal results are displayed)  Labs Reviewed  BASIC METABOLIC PANEL - Abnormal; Notable for the following:    Potassium 5.3 (*)    Glucose, Bld 130 (*)    BUN 28 (*)    GFR calc non Af Amer 53 (*)    All other components within normal limits  CBC - Abnormal; Notable for the following:    RBC 4.29 (*)    Hemoglobin 12.4 (*)    HCT 38.1 (*)    All other components within normal limits  TROPONIN I   ____________________________________________  EKG  EKG was done read and interpreted by me shows sinus tach at a rate of 105 normal axis there is some ST segment depression in V3 through 5. Right bundle-branch block is present this was not present on the last EKG that is available to me ____________________________________________  RADIOLOGY  Chest x-ray read by radiology as mild cardiomegaly and some atelectasis. ____________________________________________   PROCEDURES    ____________________________________________   INITIAL IMPRESSION / ASSESSMENT AND PLAN / ED COURSE  Pertinent labs & imaging results that were available during my care of the patient were reviewed by me and considered in my medical decision making (see chart for details).   ____________________________________________   FINAL CLINICAL IMPRESSION(S) / ED DIAGNOSES  Final diagnoses:  Chest pain, unspecified chest pain type   Abnormal EKG      Nena Polio, MD 03/13/15 [phone]

## 2015-03-13 NOTE — Consult Note (Signed)
[facility] Cardiology  CARDIOLOGY CONSULT NOTE  Patient ID: Jay Hughes MRN: [id number] DOB/AGE: [birth year] 81 y.o.  Admit date: 03/13/2015 Referring Physician Posey Pronto Primary Physician [facility] Primary Cardiologist  Reason for Consultation non-ST elevation myocardial infarction  HPI: Patient is a 81 year old gentleman, with Parkinson's disease, currently a resident at the [facility] who is admitted with chest pain. The patient apparently was in his usual state of health till night of admission when he started to experience sternal chest discomfort with radiation to both arms. She was brought to [facility] emergency room where EKG revealed sinus rhythm with right bundle-branch block. Patient was treated with Nitropaste with improvement in symptoms. The patient was admitted to telemetry, troponin is 1.25, consistent with non-ST elevation myocardial infarction. Today, the patient denies chest pain.  Review of systems complete and found to be negative unless listed above     Past Medical History  Diagnosis Date  . 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 Hypertension     History reviewed. No pertinent past surgical history.  Prescriptions prior to admission  Medication Sig Dispense Refill Last Dose  . aspirin EC 81 MG tablet Take 81 mg by mouth daily.   03/12/2015 at Unknown time  . carbidopa-levodopa (SINEMET IR) 10-100 MG per tablet Take 1 tablet by mouth daily.   03/12/2015 at Unknown time  . Cyanocobalamin (RA VITAMIN B-12 TR) 1000 MCG TBCR Take 1 tablet by mouth daily.   03/12/2015 at Unknown time  . doxazosin (CARDURA) 2 MG tablet Take 2 mg by mouth daily.   03/12/2015 at Unknown time  . lisinopril (PRINIVIL,ZESTRIL) 5 MG tablet Take 5 mg by mouth daily.   03/12/2015 at Unknown time  . nabumetone (RELAFEN) 500 MG tablet Take 500 mg by mouth daily.   03/12/2015 at Unknown time  . oxybutynin (DITROPAN-XL) 5 MG 24 hr tablet Take 5 mg by mouth daily.   03/12/2015 at Unknown time  . phenytoin (DILANTIN) 100 MG  ER capsule Take 100 mg by mouth daily.   03/12/2015 at Unknown time  . triamcinolone ointment (KENALOG) 0.1 % Apply 1 application topically 2 (two) times daily.   03/12/2015 at Unknown time   Social History   Social History  . Marital Status: Single    Spouse Name: N/A  . Number of Children: N/A  . Years of Education: N/A   Occupational History  . Not on file.   Social History Main Topics  . Smoking status: Former Research scientist (life sciences)  . Smokeless tobacco: Not on file  . Alcohol Use: No  . Drug Use: Not on file  . Sexual Activity: Not on file   Other Topics Concern  . Not on file   Social History Narrative    History reviewed. No pertinent family history.    Review of systems complete and found to be negative unless listed above      PHYSICAL EXAM  General: Well developed, well nourished, in no acute distress HEENT:  Normocephalic and atramatic Neck:  No JVD.  Lungs: Clear bilaterally to auscultation and percussion. Heart: HRRR . Normal S1 and S2 without gallops or murmurs.  Abdomen: Bowel sounds are positive, abdomen soft and non-tender  Msk:  Back normal, normal gait. Normal strength and tone for age. Extremities: No clubbing, cyanosis or edema.   Neuro: Alert and oriented X 3. Psych:  Good affect, responds appropriately  Labs:   Lab Results  Component Value Date   WBC 6.8 03/13/2015   HGB 11.6* 03/13/2015   HCT 34.6* 03/13/2015  MCV 87.5 03/13/2015   PLT 213 03/13/2015    Recent Labs Lab 03/13/15 0827  NA 139  K 4.9  CL 107  CO2 26  BUN 25*  CREATININE 0.92  CALCIUM 8.6*  GLUCOSE 105*   Lab Results  Component Value Date   TROPONINI 1.20* 03/13/2015    Lab Results  Component Value Date   CHOL 183 03/13/2015   Lab Results  Component Value Date   HDL 46 03/13/2015   Lab Results  Component Value Date   LDLCALC 121* 03/13/2015   Lab Results  Component Value Date   TRIG 79 03/13/2015   Lab Results  Component Value Date   CHOLHDL 4.0  03/13/2015   No results found for: LDLDIRECT    Radiology: Dg Chest Port 1 View  03/13/2015  CLINICAL DATA:  Acute onset of generalized chest, abdominal and arm pain. Initial encounter. EXAM: PORTABLE CHEST 1 VIEW COMPARISON:  Chest radiograph from 04/19/2014 FINDINGS: The lungs are well-aerated. Mild bibasilar atelectasis is noted. There is no evidence of pleural effusion or pneumothorax. The cardiomediastinal silhouette is mildly enlarged. No acute osseous abnormalities are seen. There is stable prominence of the right paratracheal soft tissues, reflecting normal vasculature. IMPRESSION: Mild bibasilar atelectasis noted.  Mild cardiomegaly. Electronically Signed   By: Garald Balding M.D.   On: 03/13/2015 02:48    EKG: Normal sinus rhythm with right bundle-branch block  ASSESSMENT AND PLAN:   1. Non-ST elevation myocardial infarction and elderly gentleman with Parkinson's. Patient currently chest pain-free, hemodynamically stable. 2. Essential hypertension 3. Parkinson's  Recommendations  1. Conservative management 2. Continue Lovenox 48-72 hours 3. Start aspirin and Plavix 4. Start metoprolol titrate 25-50 mg twice a day 5. Start Imdur 30-60 mg daily 6. Consider 2-D echocardiogram 7. Hesitant to recommend cardiac catheterization in this elderly gentleman with Parkinson's  Signed: Daouda Lonzo MD,PhD, [facility] 03/13/2015, 9:51 AM

## 2015-03-13 NOTE — Progress Notes (Signed)
Filed Vitals:   03/13/15 1435  BP: 104/56  Pulse: 78  Temp:   Resp:     Notified Dr. Posey Pronto of low BP. Per MD hold IMDUR.

## 2015-03-13 NOTE — Progress Notes (Signed)
Dr. Posey Pronto notified of Troponin of 1.20. Per MD hold Lovenox.

## 2015-03-13 NOTE — ED Notes (Signed)
MD at bedside. 

## 2015-03-13 NOTE — Progress Notes (Addendum)
Patient ID: Neven Fina, male   DOB: 24-Mar-1917, 81 y.o.   MRN: [id number] [facility] NAME: Deontray Hunnicutt    MR#:  [id number]  DATE OF BIRTH:  1916/10/02  SUBJECTIVE:   Hard on hearing. Came in with chest tightness REVIEW OF SYSTEMS:   Review of Systems  Constitutional: Negative for fever, chills and weight loss.  HENT: Negative for ear discharge, ear pain and nosebleeds.   Eyes: Negative for blurred vision, pain and discharge.  Respiratory: Negative for sputum production, shortness of breath, wheezing and stridor.   Cardiovascular: Negative for chest pain, palpitations, orthopnea and PND.  Gastrointestinal: Negative for nausea, vomiting, abdominal pain and diarrhea.  Genitourinary: Negative for urgency and frequency.  Musculoskeletal: Negative for back pain and joint pain.  Neurological: Negative for sensory change, speech change, focal weakness and weakness.  Psychiatric/Behavioral: Negative for depression and hallucinations. The patient is not nervous/anxious.    Tolerating Diet:yes Tolerating PT: pending  DRUG ALLERGIES:  No Known Allergies  VITALS:  Blood pressure 106/68, pulse 90, temperature 98.1 F (36.7 C), temperature source Oral, resp. rate 16, height 5\' 8"  (1.727 m), weight 77.202 kg (170 lb 3.2 oz), SpO2 92 %.  PHYSICAL EXAMINATION:   Physical Exam  GENERAL:  81 y.o.-year-old patient lying in the bed with no acute distress.  EYES: Pupils equal, round, reactive to light and accommodation. No scleral icterus. Extraocular muscles intact.  HEENT: Head atraumatic, normocephalic. Oropharynx and nasopharynx clear.  NECK:  Supple, no jugular venous distention. No thyroid enlargement, no tenderness.  LUNGS: Normal breath sounds bilaterally, no wheezing, rales, rhonchi. No use of accessory muscles of respiration.  CARDIOVASCULAR: S1, S2 normal. No murmurs, rubs, or gallops.  ABDOMEN: Soft, nontender,  nondistended. Bowel sounds present. No organomegaly or mass.  EXTREMITIES: No cyanosis, clubbing or edema b/l.    NEUROLOGIC: Cranial nerves II through XII are intact. No focal Motor or sensory deficits b/l.   PSYCHIATRIC: The patient is alert and oriented x 3.  SKIN: No obvious rash, lesion, or ulcer.    LABORATORY PANEL:   CBC  Recent Labs Lab 03/13/15 0827  WBC 6.8  HGB 11.6*  HCT 34.6*  PLT 213    Chemistries   Recent Labs Lab 03/13/15 0827  NA 139  K 4.9  CL 107  CO2 26  GLUCOSE 105*  BUN 25*  CREATININE 0.92  CALCIUM 8.6*    Cardiac Enzymes  Recent Labs Lab 03/13/15 0827  TROPONINI 1.20*    RADIOLOGY:  Dg Chest Port 1 View  03/13/2015  CLINICAL DATA:  Acute onset of generalized chest, abdominal and arm pain. Initial encounter. EXAM: PORTABLE CHEST 1 VIEW COMPARISON:  Chest radiograph from 04/19/2014 FINDINGS: The lungs are well-aerated. Mild bibasilar atelectasis is noted. There is no evidence of pleural effusion or pneumothorax. The cardiomediastinal silhouette is mildly enlarged. No acute osseous abnormalities are seen. There is stable prominence of the right paratracheal soft tissues, reflecting normal vasculature. IMPRESSION: Mild bibasilar atelectasis noted.  Mild cardiomegaly. Electronically Signed   By: Garald Balding M.D.   On: 03/13/2015 02:48     ASSESSMENT AND PLAN:  81 year old gentleman who was visiting with his son when today he complained of chest pains and generalized upper body discomfort. Radiates up to the jaws and under bilateral arms.  . Chest pain/Acute NSTEMI  -Cycle cardiac enzymes rising 0.03-->1.20 -lvoenox for 48 hours,asa,plavix,statins,ace,nitro. D/w dr Josefa Half. Recommends mendical rx -echo today -Nitroglycerin when necessary, Lopressor  when necessary  . HLD (hyperlipidemia)  -cont statins   restless leg syndrome -Stable, home medications resumed   Parkinson's dz Cont sinemt  Case discussed with Care  Management/Social Worker. Management plans discussed with the patient, family and they are in agreement.  CODE STATUS: full  DVT Prophylaxis: lovenox  TOTAL TIME TAKING CARE OF THIS PATIENT: 40 minutes.  >50% time spent on counselling and coordination of care pt, son, cardiology     Salbador Fiveash M.D on 03/13/2015 at 2:01 PM  Between 7am to 6pm - Pager - [phone]  After 6pm go to [email or web address] - password [email or web address] [place] [facility]  Office  [phone]  CC: Primary care physician; [name], MD

## 2015-03-13 NOTE — ED Notes (Signed)
Pt comes into the ED via EMS from [place] c/o generalized chest, abdomen, and arm pain.  Patient denies shortness of breath, visual changes, N/V.  Patient states "I just feel like Im going to die".  Patient in apparent pain upon assesment.  Pain started after dinner this evening.  104 HR, 94% room air, NSR.

## 2015-03-14 ENCOUNTER — Other Ambulatory Visit: Payer: Self-pay | Admitting: Internal Medicine

## 2015-03-14 ENCOUNTER — Inpatient Hospital Stay
Admit: 2015-03-14 | Discharge: 2015-03-14 | Disposition: A | Discharge status: Home / Self Care | Payer: Medicare Other | Attending: Internal Medicine | Admitting: Internal Medicine

## 2015-03-14 MED ORDER — BISACODYL 10 MG RE SUPP
10.0000 mg | Freq: Every day | RECTAL | Status: DC
  Administered 2015-03-14 – 2015-03-16 (×3): 10 mg via RECTAL
  Filled 2015-03-14 (×3): qty 1

## 2015-03-14 MED ORDER — CLOPIDOGREL BISULFATE 75 MG PO TABS: 75.0000 mg | ORAL_TABLET | Freq: Every day | ORAL | Status: DC

## 2015-03-14 MED ORDER — ISOSORBIDE MONONITRATE ER 30 MG PO TB24: 30.0000 mg | ORAL_TABLET | Freq: Every day | ORAL | Status: DC

## 2015-03-14 MED ORDER — NITROGLYCERIN 0.4 MG SL SUBL: 0.4000 mg | SUBLINGUAL_TABLET | SUBLINGUAL | Status: DC | PRN

## 2015-03-14 MED ORDER — SODIUM CHLORIDE 0.9 % IV BOLUS (SEPSIS)
250.0000 mL | Freq: Once | INTRAVENOUS | Status: AC
  Administered 2015-03-14: 250 mL via INTRAVENOUS

## 2015-03-14 NOTE — Progress Notes (Signed)
Patient has rested quietly throughout the night with no complaints of pain. Patient stated he has not had a BM in a few days and feels like he needs to go but is unable to. Will notify dayshift RN at shift change to see if MD can order something to help. Nursing staff will continue to monitor. Earleen Reaper, RN

## 2015-03-14 NOTE — Progress Notes (Signed)
Per MD Posey Pronto give patient a 250 bolus for BP issues

## 2015-03-14 NOTE — NC FL2 (Signed)
[place] LEVEL OF CARE SCREENING TOOL     IDENTIFICATION  Patient Name: Jay Hughes Birthdate: 05/16/1916 Sex: male Admission Date (Current Location): 03/13/2015  [place] and [place] Number: Engineering geologist and Address:  [facility], [address], [place], [place] [address]      Provider Number: [id number]  Attending Physician Name and Address:  Fritzi Mandes, MD  Relative Name and Phone Number:       Current Level of Care: Hospital Recommended Level of Care: [place] Prior Approval Number:    Date Approved/Denied:   PASRR Number:    Discharge Plan:  (alf)    Current Diagnoses: Patient Active Problem List   Diagnosis Date Noted  . HTN (hypertension) 03/13/2015  . Chest pain 03/13/2015  . NSTEMI (non-ST elevated myocardial infarction) ([place]) 03/13/2015  . 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 Arthritis 10/12/2014  . BP (high blood pressure) 10/12/2014  . HLD (hyperlipidemia) 10/12/2014  . Leg weakness 10/12/2014    Orientation ACTIVITIES/SOCIAL BLADDER RESPIRATION    Self, Situation, Place  Active Continent Normal  BEHAVIORAL SYMPTOMS/MOOD NEUROLOGICAL BOWEL NUTRITION STATUS     (none) Continent Diet (cardiac)  PHYSICIAN VISITS COMMUNICATION OF NEEDS Height & Weight Skin  30 days Verbally   170 lbs. Normal          AMBULATORY STATUS RESPIRATION    Supervision limited Normal      Personal Care Assistance Level of Assistance               Functional Limitations Info                SPECIAL CARE FACTORS FREQUENCY                      Additional Factors Info  Code Status, Allergies Code Status Info: DNR Allergies Info: NKA           Current Medications (03/14/2015): Current Facility-Administered Medications  Medication Dose Route Frequency Provider Last Rate Last Dose  . acetaminophen (TYLENOL) tablet 650 mg  650 mg Oral Q6H PRN Debby Crosley, MD       Or  .  acetaminophen (TYLENOL) suppository 650 mg  650 mg Rectal Q6H PRN Debby Crosley, MD      . alum & mag hydroxide-simeth (MAALOX/MYLANTA) 200-200-20 MG/5ML suspension 30 mL  30 mL Oral Q6H PRN Debby Crosley, MD      . aspirin EC tablet 81 mg  81 mg Oral Daily Debby Crosley, MD   81 mg at 03/14/15 0926  . bisacodyl (DULCOLAX) suppository 10 mg  10 mg Rectal Daily Fritzi Mandes, MD   10 mg at 03/14/15 0926  . carbidopa-levodopa (SINEMET IR) 10-100 MG per tablet immediate release 1 tablet  1 tablet Oral Daily Debby Crosley, MD   1 tablet at 03/13/15 1747  . clopidogrel (PLAVIX) tablet 75 mg  75 mg Oral Daily Fritzi Mandes, MD   75 mg at 03/14/15 0926  . doxazosin (CARDURA) tablet 2 mg  2 mg Oral QHS Debby Crosley, MD   2 mg at 03/13/15 2211  . enoxaparin (LOVENOX) injection 75 mg  1 mg/kg Subcutaneous Q12H Fritzi Mandes, MD   75 mg at 03/14/15 0923  . isosorbide mononitrate (IMDUR) 24 hr tablet 30 mg  30 mg Oral Daily Fritzi Mandes, MD   30 mg at 03/14/15 0936  . lisinopril (PRINIVIL,ZESTRIL) tablet 5 mg  5 mg Oral Daily Quintella Baton, MD   Stopped at 03/14/15  4944  . metoprolol (LOPRESSOR) injection 5 mg  5 mg Intravenous Q6H PRN Debby Crosley, MD      . nabumetone (RELAFEN) tablet 500 mg  500 mg Oral Daily Debby Crosley, MD   500 mg at 03/14/15 0937  . nitroGLYCERIN (NITROSTAT) SL tablet 0.4 mg  0.4 mg Sublingual Q5 min PRN Debby Crosley, MD      . ondansetron (ZOFRAN) tablet 4 mg  4 mg Oral Q6H PRN Debby Crosley, MD       Or  . ondansetron (ZOFRAN) injection 4 mg  4 mg Intravenous Q6H PRN Debby Crosley, MD      . oxybutynin (DITROPAN-XL) 24 hr tablet 5 mg  5 mg Oral Daily Debby Crosley, MD   5 mg at 03/14/15 0926  . phenytoin (DILANTIN) ER capsule 100 mg  100 mg Oral Daily Debby Crosley, MD   100 mg at 03/14/15 0926  . sodium chloride 0.9 % injection 3 mL  3 mL Intravenous Q12H Debby Crosley, MD   3 mL at 03/14/15 9675   Do not use this list as official medication orders. Please verify with discharge  summary.  Discharge Medications:   Medication List    TAKE these medications        aspirin EC 81 MG tablet  Take 81 mg by mouth daily.     carbidopa-levodopa 10-100 MG tablet  Commonly known as:  SINEMET IR  Take 1 tablet by mouth daily.     clopidogrel 75 MG tablet  Commonly known as:  PLAVIX  Take 1 tablet (75 mg total) by mouth daily.     doxazosin 2 MG tablet  Commonly known as:  CARDURA  Take 2 mg by mouth daily.     isosorbide mononitrate 30 MG 24 hr tablet  Commonly known as:  IMDUR  Take 1 tablet (30 mg total) by mouth daily.     lisinopril 5 MG tablet  Commonly known as:  PRINIVIL,ZESTRIL  Take 5 mg by mouth daily.     nabumetone 500 MG tablet  Commonly known as:  RELAFEN  Take 500 mg by mouth daily.     nitroGLYCERIN 0.4 MG SL tablet  Commonly known as:  NITROSTAT  Place 1 tablet (0.4 mg total) under the tongue every 5 (five) minutes as needed for chest pain.     oxybutynin 5 MG 24 hr tablet  Commonly known as:  DITROPAN-XL  Take 5 mg by mouth daily.     phenytoin 100 MG ER capsule  Commonly known as:  DILANTIN  Take 100 mg by mouth daily.     RA VITAMIN B-12 TR 1000 MCG Tbcr  Generic drug:  Cyanocobalamin  Take 1 tablet by mouth daily.     triamcinolone ointment 0.1 %  Commonly known as:  KENALOG  Apply 1 application topically 2 (two) times daily.        Relevant Imaging Results:  Relevant Lab Results:  Recent Labs    Additional Information    Shela Leff, LCSW

## 2015-03-14 NOTE — Clinical Social Work Note (Signed)
PT has evaluated patient and recommended STR. CSW spoke with patient's son who is in agreement and states that there is not much choice as Horris Latino at [facility] has stated that patient would have to transfer in his wheelchair prior to returning. Bedsearch initiated. Shela Leff MSW,LCSW [phone]

## 2015-03-14 NOTE — Care Management (Signed)
During progression dicussed that patient is from independent living at [facility] and for discharge.  It is reported that patient would need ems transport back to facility.  Questioned why independent level of care needs ems.  Spoke with Horris Latino from [facility].  Patient is independent.  He does have some assist with his bath but otherwise he cares for himself.  He has a Risk analyst.  Patient has not been out of bed since admission.  He says he is dizzy and swimming headed.  He says that he is so weak that he does not think he can transfer by himself.  Spoke with patient's sonRoselie Awkward.  Until one week ago- he was gong out with the family twice a week to eat.  Says patient has become increasingly weak.  Discussed that for patient to return to independent level of care, he would require hired caregivers round the clock until functional status improved or go to skilled nursing.  Mr Halbig says that having round the clock care at [facility] would not be an option.  Patient is currently on a waiting list at the facility for assisted level of care.  Nursing is reporting systolic blood pressure 96'U and 80's.  Attending has been updated.

## 2015-03-14 NOTE — Discharge Summary (Addendum)
[facility] [facility] [place] NAME: Jay Hughes    MR#:  [id number]  DATE OF BIRTH:  [birth year]  DATE OF ADMISSION:  03/13/2015 ADMITTING PHYSICIAN: Quintella Baton, MD  DATE OF DISCHARGE: 11/02//2016  PRIMARY CARE PHYSICIAN: [name], MD    ADMISSION DIAGNOSIS:  Abnormal EKG [R94.31] Chest pain [R07.9] Chest pain, unspecified chest pain type [R07.9]  DISCHARGE DIAGNOSIS:  Acute NSTEMI-medical rx  SECONDARY DIAGNOSIS:   Past Medical History  Diagnosis Date  . 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 Hypertension     HOSPITAL COURSE:   81 year old gentleman who was visiting with his son when today he complained of chest pains and generalized upper body discomfort. Radiates up to the jaws and under bilateral arms.  . Chest pain/Acute NSTEMI   . HLD (hyperlipidemia)  -cont statins   restless leg syndrome -Stable, home medications resumed   Parkinson's dz Cont sinemet  Hemodynamically stable and feeling much better. Agreeable with D/C plans. D/w son maureen duesing at bedside. CONSULTS OBTAINED:  Treatment Team:  Isaias Cowman, MD  DRUG ALLERGIES:  No Known Allergies  DISCHARGE MEDICATIONS:   Current Discharge Medication List    START taking these medications   Details  clopidogrel (PLAVIX) 75 MG tablet Take 1 tablet (75 mg total) by mouth daily. Qty: 30 tablet, Refills: 2    isosorbide mononitrate (IMDUR) 30 MG 24 hr tablet Take 1 tablet (30 mg total) by mouth daily. Qty: 30 tablet, Refills: 2    nitroGLYCERIN (NITROSTAT) 0.4 MG SL tablet Place 1 tablet (0.4 mg total) under the tongue every 5 (five) minutes as needed for chest pain. Qty: 30 tablet, Refills: 12      CONTINUE these medications which have NOT CHANGED   Details  aspirin EC 81 MG tablet Take 81 mg by mouth daily.   Associated Diagnoses: Atypical lymphocytosis    carbidopa-levodopa (SINEMET IR) 10-100 MG per tablet Take 1 tablet by mouth  daily.   Associated Diagnoses: Atypical lymphocytosis    Cyanocobalamin (RA VITAMIN B-12 TR) 1000 MCG TBCR Take 1 tablet by mouth daily.    doxazosin (CARDURA) 2 MG tablet Take 2 mg by mouth daily.   Associated Diagnoses: Atypical lymphocytosis    lisinopril (PRINIVIL,ZESTRIL) 5 MG tablet Take 5 mg by mouth daily.   Associated Diagnoses: Atypical lymphocytosis    nabumetone (RELAFEN) 500 MG tablet Take 500 mg by mouth daily.   Associated Diagnoses: Atypical lymphocytosis    oxybutynin (DITROPAN-XL) 5 MG 24 hr tablet Take 5 mg by mouth daily.   Associated Diagnoses: Atypical lymphocytosis    phenytoin (DILANTIN) 100 MG ER capsule Take 100 mg by mouth daily.   Associated Diagnoses: Atypical lymphocytosis    triamcinolone ointment (KENALOG) 0.1 % Apply 1 application topically 2 (two) times daily.   Associated Diagnoses: Atypical lymphocytosis        If you experience worsening of your admission symptoms, develop shortness of breath, life threatening emergency, suicidal or homicidal thoughts you must seek medical attention immediately by calling 911 or calling your MD immediately  if symptoms less severe.  You Must read complete instructions/literature along with all the possible adverse reactions/side effects for all the Medicines you take and that have been prescribed to you. Take any new Medicines after you have completely understood and accept all the possible adverse reactions/side effects.   Please note  You were cared for by a hospitalist during your hospital stay. If you have any questions about your discharge medications  or the care you received while you were in the hospital after you are discharged, you can call the unit and asked to speak with the hospitalist on call if the hospitalist that took care of you is not available. Once you are discharged, your primary care physician will handle any further medical issues. Please note that NO REFILLS for any discharge medications  will be authorized once you are discharged, as it is imperative that you return to your primary care physician (or establish a relationship with a primary care physician if you do not have one) for your aftercare needs so that they can reassess your need for medications and monitor your lab values. Today   SUBJECTIVE   No complaints  VITAL SIGNS:  Blood pressure 151/66, pulse 65, temperature 97.8 F (36.6 C), temperature source Oral, resp. rate 17, height 5\' 8"  (1.727 m), weight 77.202 kg (170 lb 3.2 oz), SpO2 98 %. PHYSICAL EXAMINATION:  GENERAL:  81 y.o.-year-old patient lying in the bed with no acute distress.  EYES: Pupils equal, round, reactive to light and accommodation. No scleral icterus. Extraocular muscles intact.  HEENT: Head atraumatic, normocephalic. Oropharynx and nasopharynx clear.  NECK:  Supple, no jugular venous distention. No thyroid enlargement, no tenderness.  LUNGS: Normal breath sounds bilaterally, no wheezing, rales,rhonchi or crepitation. No use of accessory muscles of respiration.  CARDIOVASCULAR: S1, S2 normal. No murmurs, rubs, or gallops.  ABDOMEN: Soft, non-tender, non-distended. Bowel sounds present. No organomegaly or mass.  EXTREMITIES: No pedal edema, cyanosis, or clubbing.  NEUROLOGIC: Cranial nerves II through XII are intact. Muscle strength 5/5 in all extremities. Sensation intact. Gait not checked.  PSYCHIATRIC: The patient is alert and oriented x 3.  SKIN: No obvious rash, lesion, or ulcer.  DATA REVIEW:   CBC   Recent Labs Lab 03/13/15 0827  WBC 6.8  HGB 11.6*  HCT 34.6*  PLT 213    Chemistries   Recent Labs Lab 03/13/15 0827  NA 139  K 4.9  CL 107  CO2 26  GLUCOSE 105*  BUN 25*  CREATININE 0.92  CALCIUM 8.6*    Microbiology Results   Recent Results (from the past 240 hour(s))  MRSA PCR Screening     Status: None   Collection Time: 03/13/15  5:58 AM  Result Value Ref Range Status   MRSA by PCR NEGATIVE NEGATIVE Final     Comment:        The GeneXpert MRSA Assay (FDA approved for NASAL specimens only), is one component of a comprehensive MRSA colonization surveillance program. It is not intended to diagnose MRSA infection nor to guide or monitor treatment for MRSA infections.     Management plans discussed with the patient, family and they are in agreement.  CODE STATUS: DNR  TOTAL TIME TAKING CARE OF THIS PATIENT: 40 minutes.    [facility], Jarmal Lewelling M.D on 03/16/2015 at 11:53 AM  Between 7am to 6pm - Pager - [phone] After 6pm go to [email or web address] - password [email or web address] [place] [facility]  Office  [phone]  CC: Primary care physician; [name], MD

## 2015-03-14 NOTE — NC FL2 (Signed)
[place] LEVEL OF CARE SCREENING TOOL     IDENTIFICATION  Patient Name: Jay Hughes Birthdate: 1916/10/08 Sex: male Admission Date (Current Location): 03/13/2015  [place] and [place] Number:  Set designer)   Facility and Address:  [facility], [address], [place], [place] [address]      Provider Number: [id number]  Attending Physician Name and Address:  Fritzi Mandes, MD  Relative Name and Phone Number:       Current Level of Care: Hospital Recommended Level of Care: [place] Prior Approval Number:    Date Approved/Denied:   PASRR Number:    Discharge Plan: SNF    Current Diagnoses: Patient Active Problem List   Diagnosis Date Noted  . HTN (hypertension) 03/13/2015  . Chest pain 03/13/2015  . NSTEMI (non-ST elevated myocardial infarction) ([place]) 03/13/2015  . 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 Arthritis 10/12/2014  . BP (high blood pressure) 10/12/2014  . HLD (hyperlipidemia) 10/12/2014  . Leg weakness 10/12/2014    Orientation ACTIVITIES/SOCIAL BLADDER RESPIRATION    Self, Situation, Place  Active Incontinent Normal  BEHAVIORAL SYMPTOMS/MOOD NEUROLOGICAL BOWEL NUTRITION STATUS   (None)  (none) Continent Diet  PHYSICIAN VISITS COMMUNICATION OF NEEDS Height & Weight Skin  30 days Verbally   170 lbs. Normal          AMBULATORY STATUS RESPIRATION     (non ambulatory at baseline but needs to transfer on his own from wheelchair and currently cannot) Normal      Personal Care Assistance Level of Assistance  Bathing, Feeding, Dressing Bathing Assistance: Limited assistance Feeding assistance: Limited assistance Dressing Assistance: Limited assistance      Functional Limitations Info  Sight, Hearing Sight Info: Impaired Hearing Info: Impaired         SPECIAL CARE FACTORS FREQUENCY  PT (By licensed PT)                   Additional Factors Info  Code Status, Allergies Code Status Info:  DNR Allergies Info: nka           Current Medications (03/14/2015): Current Facility-Administered Medications  Medication Dose Route Frequency Provider Last Rate Last Dose  . acetaminophen (TYLENOL) tablet 650 mg  650 mg Oral Q6H PRN Debby Crosley, MD       Or  . acetaminophen (TYLENOL) suppository 650 mg  650 mg Rectal Q6H PRN Debby Crosley, MD      . alum & mag hydroxide-simeth (MAALOX/MYLANTA) 200-200-20 MG/5ML suspension 30 mL  30 mL Oral Q6H PRN Debby Crosley, MD      . aspirin EC tablet 81 mg  81 mg Oral Daily Debby Crosley, MD   81 mg at 03/14/15 0926  . bisacodyl (DULCOLAX) suppository 10 mg  10 mg Rectal Daily Fritzi Mandes, MD   10 mg at 03/14/15 0926  . carbidopa-levodopa (SINEMET IR) 10-100 MG per tablet immediate release 1 tablet  1 tablet Oral Daily Debby Crosley, MD   1 tablet at 03/13/15 1747  . clopidogrel (PLAVIX) tablet 75 mg  75 mg Oral Daily Fritzi Mandes, MD   75 mg at 03/14/15 0926  . doxazosin (CARDURA) tablet 2 mg  2 mg Oral QHS Debby Crosley, MD   2 mg at 03/13/15 2211  . enoxaparin (LOVENOX) injection 75 mg  1 mg/kg Subcutaneous Q12H Fritzi Mandes, MD   75 mg at 03/14/15 0923  . isosorbide mononitrate (IMDUR) 24 hr tablet 30 mg  30 mg Oral Daily Fritzi Mandes, MD  30 mg at 03/14/15 0936  . lisinopril (PRINIVIL,ZESTRIL) tablet 5 mg  5 mg Oral Daily Quintella Baton, MD   Stopped at 03/14/15 0934  . metoprolol (LOPRESSOR) injection 5 mg  5 mg Intravenous Q6H PRN Debby Crosley, MD      . nabumetone (RELAFEN) tablet 500 mg  500 mg Oral Daily Debby Crosley, MD   500 mg at 03/14/15 0937  . nitroGLYCERIN (NITROSTAT) SL tablet 0.4 mg  0.4 mg Sublingual Q5 min PRN Debby Crosley, MD      . ondansetron (ZOFRAN) tablet 4 mg  4 mg Oral Q6H PRN Debby Crosley, MD       Or  . ondansetron (ZOFRAN) injection 4 mg  4 mg Intravenous Q6H PRN Debby Crosley, MD      . oxybutynin (DITROPAN-XL) 24 hr tablet 5 mg  5 mg Oral Daily Debby Crosley, MD   5 mg at 03/14/15 0926  . phenytoin (DILANTIN) ER  capsule 100 mg  100 mg Oral Daily Debby Crosley, MD   100 mg at 03/14/15 0926  . sodium chloride 0.9 % injection 3 mL  3 mL Intravenous Q12H Debby Crosley, MD   3 mL at 03/14/15 1749   Do not use this list as official medication orders. Please verify with discharge summary.  Discharge Medications:   Medication List    TAKE these medications        aspirin EC 81 MG tablet  Take 81 mg by mouth daily.     carbidopa-levodopa 10-100 MG tablet  Commonly known as:  SINEMET IR  Take 1 tablet by mouth daily.     clopidogrel 75 MG tablet  Commonly known as:  PLAVIX  Take 1 tablet (75 mg total) by mouth daily.     doxazosin 2 MG tablet  Commonly known as:  CARDURA  Take 2 mg by mouth daily.     isosorbide mononitrate 30 MG 24 hr tablet  Commonly known as:  IMDUR  Take 1 tablet (30 mg total) by mouth daily.     lisinopril 5 MG tablet  Commonly known as:  PRINIVIL,ZESTRIL  Take 5 mg by mouth daily.     nabumetone 500 MG tablet  Commonly known as:  RELAFEN  Take 500 mg by mouth daily.     nitroGLYCERIN 0.4 MG SL tablet  Commonly known as:  NITROSTAT  Place 1 tablet (0.4 mg total) under the tongue every 5 (five) minutes as needed for chest pain.     oxybutynin 5 MG 24 hr tablet  Commonly known as:  DITROPAN-XL  Take 5 mg by mouth daily.     phenytoin 100 MG ER capsule  Commonly known as:  DILANTIN  Take 100 mg by mouth daily.     RA VITAMIN B-12 TR 1000 MCG Tbcr  Generic drug:  Cyanocobalamin  Take 1 tablet by mouth daily.     triamcinolone ointment 0.1 %  Commonly known as:  KENALOG  Apply 1 application topically 2 (two) times daily.        Relevant Imaging Results:  Relevant Lab Results:  Recent Labs    Additional Information SS: [id number]  Shela Leff, LCSW

## 2015-03-14 NOTE — Progress Notes (Signed)
PT Cancellation Note  Patient Details Name: Jay Hughes MRN: [id number] DOB: [birth year]   Cancelled Treatment:    Reason Eval/Treat Not Completed: Medical issues which prohibited therapy. PT attempted mobility evaluation, patient read 73/41 on Dynamap BP and had voided in the bed. RN staff alerted and informed PT after PT had exited the room that manual BP was in the 484F systolic. PT will re-attempt mobility evaluation at a later date/time as appropriate.  Kerman Passey, PT, DPT    03/14/2015, 12:08 PM

## 2015-03-14 NOTE — Progress Notes (Signed)
Dr. Posey Pronto aware of BP 104/56, HR 89. Per MD give imdur and hold Lisinopril.

## 2015-03-14 NOTE — Clinical Social Work Note (Signed)
CSW contacted Horris Latino at [facility] and she stated patient could return today as MD had ordered. RN CM has contacted Horris Latino as well and was told that patient would have to transfer independently from motorized wheelchair. RN CM has asked for PT to assess. Shela Leff MSW,LCSW [phone]

## 2015-03-14 NOTE — Evaluation (Signed)
Physical Therapy Evaluation Patient Details Name: Mariana Goytia MRN: [id number] DOB: 10-11-1916 Today's Date: 03/14/2015   History of Present Illness  Patient is a pleasant 81 y/o male that presents after having chest pains radiating to bilateral UEs. Patient found to have an NSTEMI on this admission. Patient has a history of Parkinson's and has most recently only been transferring from bed/chair to [place]. He lives independently currently but is on the waiting list for assisted living.   Clinical Impression  Per patient and son present earlier, he has been transferring to and from his power chair independently recently. He lives alone and has been independent with this, aside from a few short periods where he developed LE weakness. On this evaluation patient requires significant assistance for bed mobility and seated balance initially and is unable to transfer to chair with max A x1 secondary to LE weakness. After several attempts to complete with chair in front of him for arm rail assist to mimic his power chair, he became frustrated and declined further attempts. Patient has weakness globally, but particularly his RLE which he states has been an ongoing issue. Given his decline in independence with mobility, PT recommends STR transition prior to return home to increase his mobility independence.     Follow Up Recommendations SNF    Equipment Recommendations       Recommendations for Other Services       Precautions / Restrictions Precautions Precautions: Fall Restrictions Weight Bearing Restrictions: No      Mobility  Bed Mobility Overal bed mobility: Needs Assistance Bed Mobility: Supine to Sit;Sit to Supine     Supine to sit: Min guard Sit to supine: Max assist   General bed mobility comments: Patient demonstrates significant RLE weakness and deficits in mobility and requires assistance to bring his RLE off the bed. Patient unable to bring his LEs up over the threshold of  the bed to return to supine.   Transfers                 General transfer comment: Attempted x 3, with max A x1 unable to bring himself upright and becomes frustrated and kept uttering "my legs just can't do it".   Ambulation/Gait             General Gait Details: Deferred   Stairs            Wheelchair Mobility    Modified Rankin (Stroke Patients Only)       Balance Overall balance assessment: Needs assistance Sitting-balance support: Bilateral upper extremity supported;Feet supported Sitting balance-Leahy Scale: Poor Sitting balance - Comments: Patient leans to the left and posteriorly and requires min A x1 to maintain his trunk upright. Once in balanced sitting position patient can hold himself upright with no assistance.  Postural control: Posterior lean;Left lateral lean                                   Pertinent Vitals/Pain Pain Assessment: No/denies pain (Denies chest pain)    Home Living Family/patient expects to be discharged to:: Skilled nursing facility Living Arrangements: Alone Available Help at Discharge: Available PRN/intermittently Type of Home: [place]: One level Home Equipment: Wheelchair - power      Prior Function Level of Independence: Independent with assistive device(s)         Comments: Patient has been transferring to and  from his power chair independently per his son.      Hand Dominance        Extremity/Trunk Assessment   Upper Extremity Assessment: Difficult to assess due to impaired cognition           Lower Extremity Assessment: Difficult to assess due to impaired cognition      Cervical / Trunk Assessment: Kyphotic  Communication   Communication: HOH  Cognition Arousal/Alertness: Awake/alert Behavior During Therapy: WFL for tasks assessed/performed;Anxious Overall Cognitive Status: History of cognitive impairments - at baseline (Able to recall  short term events, provides mixed answers with other questions)                      General Comments      Exercises Other Exercises Other Exercises: Performed scooting via bilateral UEs with min A x1 to move up to the head of the bed x3 attempts.       Assessment/Plan    PT Assessment Patient needs continued PT services  PT Diagnosis Difficulty walking;Generalized weakness   PT Problem List Decreased activity tolerance;Decreased balance;Decreased safety awareness;Decreased mobility;Decreased strength  PT Treatment Interventions DME instruction;Therapeutic activities;Therapeutic exercise;Balance training;Functional mobility training;Wheelchair mobility training   PT Goals (Current goals can be found in the Care Plan section) Acute Rehab PT Goals Patient Stated Goal: To return home when possible.  PT Goal Formulation: With patient/family Time For Goal Achievement: 03/28/15 Potential to Achieve Goals: Fair    Frequency Min 2X/week   Barriers to discharge Decreased caregiver support Patient lives independently however he was unable to transfer independently today.     Co-evaluation               End of Session Equipment Utilized During Treatment: Gait belt Activity Tolerance: Patient tolerated treatment well;Patient limited by lethargy;Treatment limited secondary to agitation Patient left: in bed;with bed alarm set;with call bell/phone within reach Nurse Communication: Mobility status         Time: 6381-7711 PT Time Calculation (min) (ACUTE ONLY): 24 min   Charges:   PT Evaluation $Initial PT Evaluation Tier I: 1 Procedure     PT G Codes:       Kerman Passey, PT, DPT    03/14/2015, 3:24 PM

## 2015-03-14 NOTE — Progress Notes (Signed)
*  PRELIMINARY RESULTS* Echocardiogram 2D Echocardiogram has been performed.  Jay Hughes 03/14/2015, 3:39 PM

## 2015-03-14 NOTE — Care Management (Signed)
Informed by physical therapy that patient is not able to assist at all with a transfer due to weakness.  Recommend skilled nursing placement.  CSW is aware

## 2015-03-15 NOTE — Progress Notes (Signed)
Patient ID: Ahmaud Duthie, male   DOB: [birth year], 81 y.o.   MRN: [id number] [place] [facility] [place] NAME: Refoel Palladino    MR#:  [id number]  DATE OF BIRTH:  Dec 03, 1916  SUBJECTIVE:   Overall feels better. Some fluctuations in blood pressure. Blood pressure now on the higher side. REVIEW OF SYSTEMS:   Review of Systems  Constitutional: Negative for fever, chills and weight loss.  HENT: Negative for ear discharge, ear pain and nosebleeds.   Eyes: Negative for blurred vision, pain and discharge.  Respiratory: Negative for sputum production, shortness of breath, wheezing and stridor.   Cardiovascular: Negative for chest pain, palpitations, orthopnea and PND.  Gastrointestinal: Negative for nausea, vomiting, abdominal pain and diarrhea.  Genitourinary: Negative for urgency and frequency.  Musculoskeletal: Negative for back pain and joint pain.  Neurological: Negative for sensory change, speech change, focal weakness and weakness.  Psychiatric/Behavioral: Negative for depression and hallucinations. The patient is not nervous/anxious.   All other systems reviewed and are negative.  Tolerating Diet: Yes Tolerating PT: Recommends rehabilitation  DRUG ALLERGIES:  No Known Allergies  VITALS:  Blood pressure 171/75, pulse 76, temperature 98.1 F (36.7 C), temperature source Oral, resp. rate 18, height 5\' 8"  (1.727 m), weight 77.202 kg (170 lb 3.2 oz), SpO2 98 %.  PHYSICAL EXAMINATION:   Physical Exam  GENERAL:  81 y.o.-year-old patient lying in the bed with no acute distress.  EYES: Pupils equal, round, reactive to light and accommodation. No scleral icterus. Extraocular muscles intact.  HEENT: Head atraumatic, normocephalic. Oropharynx and nasopharynx clear.  NECK:  Supple, no jugular venous distention. No thyroid enlargement, no tenderness.  LUNGS: Normal breath sounds bilaterally, no wheezing, rales, rhonchi. No use of accessory muscles of  respiration.  CARDIOVASCULAR: S1, S2 normal. No murmurs, rubs, or gallops.  ABDOMEN: Soft, nontender, nondistended. Bowel sounds present. No organomegaly or mass.  EXTREMITIES: No cyanosis, clubbing or edema b/l.    NEUROLOGIC: Cranial nerves II through XII are intact. No focal Motor or sensory deficits b/l.   PSYCHIATRIC: The patient is alert and oriented x 3.  SKIN: No obvious rash, lesion, or ulcer.    LABORATORY PANEL:   CBC  Recent Labs Lab 03/13/15 0827  WBC 6.8  HGB 11.6*  HCT 34.6*  PLT 213    Chemistries   Recent Labs Lab 03/13/15 0827  NA 139  K 4.9  CL 107  CO2 26  GLUCOSE 105*  BUN 25*  CREATININE 0.92  CALCIUM 8.6*    Cardiac Enzymes  Recent Labs Lab 03/13/15 1422  TROPONINI 0.94*    RADIOLOGY:  No results found.   ASSESSMENT AND PLAN:   81 year old gentleman who was visiting with his son when today he complained of chest pains and generalized upper body discomfort. Radiates up to the jaws and under bilateral arms.  . Chest pain/Acute NSTEMI -Cycle cardiac enzymes rising 0.03-->1.20-->0.94 -lvoenox for 48 hours,asa,plavix,statins,ace,nitro. D/w dr Josefa Half. Recommends mendical rx -echo today -Nitroglycerin when necessary, Lopressor when necessary -pt is cp free. He will get his lovenox tody before d/c  . HLD (hyperlipidemia)  -cont statins   restless leg syndrome -Stable, home medications resumed   Parkinson's dz Cont sinemet  History of hypertension. Patient's blood pressure has been labile and was hypotensive yesterday however today her pressure is in the 170/105. We'll adjust blood pressure meds today.  Overall improving slowly. If remains stable tomorrow discussed DC to [place]  Case discussed with Care  Management/Social Worker. Management plans discussed with the patient, family and they are in agreement.  CODE STATUS: DO NOT RESUSCITATE  DVT Prophylaxis: Lovenox  TOTAL TIME TAKING CARE OF THIS PATIENT: 35  minutes.  >50% time spent on counselling and coordination of care  POSSIBLE D/C IN one to 2 DAYS, DEPENDING ON CLINICAL CONDITION.   Mayerly Kaman M.D on 03/15/2015 at 3:35 PM  Between 7am to 6pm - Pager - [phone]  After 6pm go to [email or web address] - password [email or web address] [facility]  Office  [phone]  CC: Primary care physician; [name], MD

## 2015-03-15 NOTE — Progress Notes (Signed)
Physical Therapy Treatment Patient Details Name: Jay Hughes MRN: [id number] DOB: 01/14/1917 Today's Date: 03/15/2015    History of Present Illness Patient is a pleasant 81 y/o male that presents after having chest pains radiating to bilateral UEs. Patient found to have an NSTEMI on this admission. Patient has a history of Parkinson's and has most recently only been transferring from bed/chair to [place]. He lives independently currently but is on the waiting list for assisted living.     PT Comments    Patient demonstrates increased sitting balance independence and is able to provide increased effort with standing attempts in this session. Patient continues to be limited with stand pivot transfers to and from the chair with use of UEs in chair across from him. Patient able to provide improved effort with stand pivot transfer with max A x2. Patient continues to demonstrate RLE weakness (particularly quadricep) limiting his ambulation, however this appears to be chronic per his son. He struggles to perform even SAQ independently with RLE. Skilled PT services continue to be indicated to address his mobility deficits.   Follow Up Recommendations  SNF     Equipment Recommendations       Recommendations for Other Services       Precautions / Restrictions Precautions Precautions: Fall Restrictions Weight Bearing Restrictions: No    Mobility  Bed Mobility Overal bed mobility: Needs Assistance Bed Mobility: Supine to Sit     Supine to sit: Min assist;Mod assist     General bed mobility comments: Patient requires increased assistance with trunkal elevation during this session, however he is able to bring bilateral LEs to the cusp of the edge of the bed, he still requires additional assistance to bring RLE off the edge of the bed.   Transfers Overall transfer level: Needs assistance   Transfers: Stand Pivot Transfers   Stand pivot transfers: +2 physical assistance;Max assist        General transfer comment: Attempted sit to stand transfer x 3 with minimal hip clearance from bed, instead completed stand pivot transfer with max A x2 due to RLE weakness and patient not having anything to use his arms to push up from (bed is too malleable).   Ambulation/Gait             General Gait Details: Deferred    Stairs            Wheelchair Mobility    Modified Rankin (Stroke Patients Only)       Balance Overall balance assessment: Needs assistance Sitting-balance support: Bilateral upper extremity supported;Feet supported Sitting balance-Leahy Scale: Fair Sitting balance - Comments: Patient is able to sit without support at edge of bed, however he maintains his cervical spine in flexion throughout sitting.  Postural control: Posterior lean                          Cognition Arousal/Alertness: Awake/alert Behavior During Therapy: WFL for tasks assessed/performed;Anxious Overall Cognitive Status: History of cognitive impairments - at baseline (Patient is able to recall things such as his HS football position, perseverates again today.)                      Exercises General Exercises - Lower Extremity Ankle Circles/Pumps: AROM;Both;10 reps Short Arc QuadSinclair Ship;Right;10 reps Long Arc Quad: AROM;AAROM;Both;15 reps Heel Slides: AROM;AAROM;15 reps;Both Hip ABduction/ADduction: AROM;AAROM;15 reps Straight Leg Raises: AROM;AAROM;Both;10 reps    General Comments        Pertinent  Vitals/Pain Pain Assessment: No/denies pain    Home Living                      Prior Function            PT Goals (current goals can now be found in the care plan section) Acute Rehab PT Goals Patient Stated Goal: To return home when possible.  PT Goal Formulation: With patient/family Time For Goal Achievement: 03/28/15 Potential to Achieve Goals: Fair Progress towards PT goals: Progressing toward goals    Frequency  Min 2X/week     PT Plan Current plan remains appropriate    Co-evaluation             End of Session Equipment Utilized During Treatment: Gait belt Activity Tolerance: Patient tolerated treatment well Patient left: in chair;with family/visitor present;with call bell/phone within reach     Time: 0955-1019 PT Time Calculation (min) (ACUTE ONLY): 24 min  Charges:  $Therapeutic Exercise: 8-22 mins $Therapeutic Activity: 8-22 mins                    G Codes:      Kerman Passey, PT, DPT     03/15/2015, 2:25 PM

## 2015-03-15 NOTE — Care Management Important Message (Signed)
Important Message  Patient Details  Name: Jay Hughes MRN: [id number] Date of Birth: [birth year]   Medicare Important Message Given:  Yes-second notification given    [name] 03/15/2015, 10:29 AM

## 2015-03-15 NOTE — Clinical Social Work Note (Signed)
Patient has had offers from both facilities of choice: [facility] and [facility]. Patient's son has chosen [facility].  Shela Leff MSW,LCSW [phone]

## 2015-03-15 NOTE — Progress Notes (Signed)
Patient has rested quietly tonight. No complaints of pain and no signs of discomfort or distress noted. Nursing staff will continue to monitor. Gearl Baratta A Dierre Crevier, RN 

## 2015-03-16 MED ORDER — CLOPIDOGREL BISULFATE 75 MG PO TABS: 75.0000 mg | ORAL_TABLET | Freq: Every day | ORAL | Status: AC

## 2015-03-16 MED ORDER — ISOSORBIDE MONONITRATE ER 30 MG PO TB24: 30.0000 mg | ORAL_TABLET | Freq: Every day | ORAL | Status: AC

## 2015-03-16 MED ORDER — NITROGLYCERIN 0.4 MG SL SUBL: 0.4000 mg | SUBLINGUAL_TABLET | SUBLINGUAL | Status: AC | PRN

## 2015-03-16 NOTE — Progress Notes (Signed)
Patient rested quietly throughout the night. No complaints of pain and no signs of discomfort or distress noted. Nursing staff will continue to monitor. Earleen Reaper, RN

## 2015-03-16 NOTE — Clinical Social Work Placement (Signed)
   CLINICAL SOCIAL WORK PLACEMENT  NOTE  Date:  03/16/2015  Patient Details  Name: Jay Hughes MRN: [id number] Date of Birth: Oct 25, 1916  Clinical Social Work is seeking post-discharge placement for this patient at the [place] level of care (*CSW will initial, date and re-position this form in  chart as items are completed):  Yes   Patient/family provided with [place] Work Department's list of facilities offering this level of care within the geographic area requested by the patient (or if unable, by the patient's family).  Yes   Patient/family informed of their freedom to choose among providers that offer the needed level of care, that participate in Medicare, Medicaid or managed care program needed by the patient, have an available bed and are willing to accept the patient.  Yes   Patient/family informed of 's ownership interest in [facility] and [facility], as well as of the fact that they are under no obligation to receive care at these facilities.  PASRR submitted to EDS on 03/14/15     PASRR number received on 03/14/15     Existing PASRR number confirmed on       FL2 transmitted to all facilities in geographic area requested by pt/family on 03/14/15     FL2 transmitted to all facilities within larger geographic area on       Patient informed that his/her managed care company has contracts with or will negotiate with certain facilities, including the following:        Yes   Patient/family informed of bed offers received.  Patient chooses bed at  [facility])     Physician recommends and patient chooses bed at  [facility])    Patient to be transferred to  [facility]) on 03/16/15.  Patient to be transferred to facility by  (EMS)     Patient family notified on 03/16/15 of transfer.  Name of family member notified:   (patient's son)     PHYSICIAN       Additional Comment:     _______________________________________________ Shela Leff, LCSW 03/16/2015, 1:50 PM

## 2015-03-16 NOTE — Clinical Social Work Note (Signed)
Patient to discharge today to [facility]. Discharge summary info sent. Nurse to call report. Patient's son aware and in agreement and requesting EMS transport. Shela Leff MSW,LCSW [phone]

## 2015-03-16 NOTE — Discharge Instructions (Signed)
Heart Attack A heart attack (myocardial infarction, MI) causes damage to the heart that cannot be fixed. A heart attack often happens when a blood clot or other blockage cuts blood flow to the heart. When this happens, certain areas of the heart begin to die. This causes the pain you feel during a heart attack. HOME CARE  Take medicine as told by your doctor. You may need medicine to:  Keep your blood from clotting too easily.  Control your blood pressure.  Lower your cholesterol.  Control abnormal heart rhythms.  Change certain behaviors as told by your doctor. This may include:  Quitting smoking.  Being active.  Eating a heart-healthy diet. Ask your doctor for help with this diet.  Keeping a healthy weight.  Keeping your diabetes under control.  Lessening stress.  Limiting how much alcohol you drink. Do not take these medicines unless your doctor says that you can:  Nonsteroidal anti-inflammatory drugs (NSAIDs). These include:  Ibuprofen.  Naproxen.  Celecoxib.  Vitamin supplements that have vitamin A, vitamin E, or both.  Hormone therapy that contains estrogen with or without progestin. GET HELP RIGHT AWAY IF:  You have sudden chest discomfort.  You have sudden discomfort in your:  Arms.  Back.  Neck.  Jaw.  You have shortness of breath at any time.  You have sudden sweating or clammy skin.  You feel sick to your stomach (nauseous) or throw up (vomit).  You suddenly get light-headed or dizzy.  You feel your heart beating fast or skipping beats. These symptoms may be an emergency. Do not wait to see if the symptoms will go away. Get medical help right away. Call your local emergency services (911 in the [place] Do not drive yourself to the hospital.   This information is not intended to replace advice given to you by your health care provider. Make sure you discuss any questions you have with your health care provider.   Document Released:  10/30/2011 Document Revised: 09/14/2014 Document Reviewed: 07/03/2013 Elsevier Interactive Patient Education [employer].

## 2015-03-16 NOTE — NC FL2 (Signed)
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 HTN (hypertension) 03/13/2015  . Chest pain 03/13/2015  . NSTEMI (non-ST elevated myocardial infarction) ([place]) 03/13/2015  . 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 Arthritis 10/12/2014  . BP (high blood pressure) 10/12/2014  . HLD (hyperlipidemia) 10/12/2014  . Leg weakness 10/12/2014    Orientation ACTIVITIES/SOCIAL BLADDER RESPIRATION    Self, Situation, Place  Active Incontinent Normal  BEHAVIORAL SYMPTOMS/MOOD NEUROLOGICAL BOWEL NUTRITION STATUS   (None)  (none) Continent Diet  PHYSICIAN VISITS COMMUNICATION OF NEEDS Height & Weight Skin  30 days Verbally   170 lbs. Normal          AMBULATORY STATUS RESPIRATION     (non ambulatory at baseline but needs to transfer on his own from wheelchair and currently cannot) Normal      Personal Care Assistance Level of Assistance  Bathing, Feeding, Dressing Bathing Assistance: Limited assistance Feeding assistance: Limited assistance Dressing Assistance: Limited assistance      Functional Limitations Info  Sight, Hearing Sight Info: Impaired Hearing Info: Impaired         SPECIAL CARE FACTORS FREQUENCY  PT (By licensed PT)                   Additional Factors Info  Code Status, Allergies Code Status Info:  DNR Allergies Info: nka           Current Medications (03/16/2015): Current Facility-Administered Medications  Medication Dose Route Frequency Provider Last Rate Last Dose  . acetaminophen (TYLENOL) tablet 650 mg  650 mg Oral Q6H PRN Debby Crosley, MD       Or  . acetaminophen (TYLENOL) suppository 650 mg  650 mg Rectal Q6H PRN Debby Crosley, MD      . alum & mag hydroxide-simeth (MAALOX/MYLANTA) 200-200-20 MG/5ML suspension 30 mL  30 mL Oral Q6H PRN Debby Crosley, MD      . aspirin EC tablet 81 mg  81 mg Oral Daily Debby Crosley, MD   81 mg at 03/15/15 1108  . bisacodyl (DULCOLAX) suppository 10 mg  10 mg Rectal Daily Fritzi Mandes, MD   10 mg at 03/15/15 1108  . carbidopa-levodopa (SINEMET IR) 10-100 MG per tablet immediate release 1 tablet  1 tablet Oral Daily Quintella Baton, MD   1 tablet at 03/15/15 1803  . clopidogrel (PLAVIX) tablet 75 mg  75 mg Oral Daily Fritzi Mandes, MD   75 mg at 03/15/15 1107  . doxazosin (CARDURA) tablet 2 mg  2 mg Oral QHS Debby Crosley, MD   2 mg at 03/15/15 2136  . enoxaparin (LOVENOX) injection 75 mg  1 mg/kg Subcutaneous Q12H Fritzi Mandes, MD   75 mg at 03/15/15 2136  . isosorbide mononitrate (IMDUR) 24 hr tablet 30 mg  30 mg Oral Daily Fritzi Mandes, MD  30 mg at 03/15/15 1107  . lisinopril (PRINIVIL,ZESTRIL) tablet 5 mg  5 mg Oral Daily Debby Crosley, MD   5 mg at 03/15/15 1107  . metoprolol (LOPRESSOR) injection 5 mg  5 mg Intravenous Q6H PRN Debby Crosley, MD      . nabumetone (RELAFEN) tablet 500 mg  500 mg Oral Daily Debby Crosley, MD   500 mg at 03/15/15 1106  . nitroGLYCERIN (NITROSTAT) SL tablet 0.4 mg  0.4 mg Sublingual Q5 min PRN Debby Crosley, MD      . ondansetron (ZOFRAN) tablet 4 mg  4 mg Oral Q6H PRN Debby Crosley, MD       Or  . ondansetron (ZOFRAN) injection 4 mg  4 mg Intravenous Q6H PRN Debby Crosley, MD      . oxybutynin (DITROPAN-XL) 24 hr tablet 5 mg  5 mg Oral Daily Debby Crosley, MD   5 mg at 03/15/15 1107  . phenytoin (DILANTIN) ER  capsule 100 mg  100 mg Oral Daily Debby Crosley, MD   100 mg at 03/15/15 1107  . sodium chloride 0.9 % injection 3 mL  3 mL Intravenous Q12H Debby Crosley, MD   3 mL at 03/15/15 2136   Do not use this list as official medication orders. Please verify with discharge summary.  Discharge Medications:   Medication List    TAKE these medications        aspirin EC 81 MG tablet  Take 81 mg by mouth daily.     carbidopa-levodopa 10-100 MG tablet  Commonly known as:  SINEMET IR  Take 1 tablet by mouth daily.     clopidogrel 75 MG tablet  Commonly known as:  PLAVIX  Take 1 tablet (75 mg total) by mouth daily.     doxazosin 2 MG tablet  Commonly known as:  CARDURA  Take 2 mg by mouth daily.     isosorbide mononitrate 30 MG 24 hr tablet  Commonly known as:  IMDUR  Take 1 tablet (30 mg total) by mouth daily.     lisinopril 5 MG tablet  Commonly known as:  PRINIVIL,ZESTRIL  Take 5 mg by mouth daily.     nabumetone 500 MG tablet  Commonly known as:  RELAFEN  Take 500 mg by mouth daily.     nitroGLYCERIN 0.4 MG SL tablet  Commonly known as:  NITROSTAT  Place 1 tablet (0.4 mg total) under the tongue every 5 (five) minutes as needed for chest pain.     oxybutynin 5 MG 24 hr tablet  Commonly known as:  DITROPAN-XL  Take 5 mg by mouth daily.     phenytoin 100 MG ER capsule  Commonly known as:  DILANTIN  Take 100 mg by mouth daily.     RA VITAMIN B-12 TR 1000 MCG Tbcr  Generic drug:  Cyanocobalamin  Take 1 tablet by mouth daily.     triamcinolone ointment 0.1 %  Commonly known as:  KENALOG  Apply 1 application topically 2 (two) times daily.        Relevant Imaging Results:  Relevant Lab Results:  Recent Labs    Additional Information SS: [id number]  Shela Leff, LCSW

## 2015-05-15 ENCOUNTER — Emergency Department
Admission: EM | Admit: 2015-05-15 | Discharge: 2015-05-15 | Disposition: A | Discharge status: Home / Self Care | Payer: Medicare Other | Attending: Emergency Medicine | Admitting: Emergency Medicine

## 2015-05-15 ENCOUNTER — Encounter: Payer: Self-pay | Admitting: Emergency Medicine

## 2015-05-15 DIAGNOSIS — Y9289 Other specified places as the place of occurrence of the external cause: Secondary | ICD-10-CM | POA: Insufficient documentation

## 2015-05-15 DIAGNOSIS — Z87891 Personal history of nicotine dependence: Secondary | ICD-10-CM | POA: Diagnosis not present

## 2015-05-15 DIAGNOSIS — I10 Essential (primary) hypertension: Secondary | ICD-10-CM | POA: Diagnosis not present

## 2015-05-15 DIAGNOSIS — Y9389 Activity, other specified: Secondary | ICD-10-CM | POA: Insufficient documentation

## 2015-05-15 DIAGNOSIS — Y998 Other external cause status: Secondary | ICD-10-CM | POA: Insufficient documentation

## 2015-05-15 DIAGNOSIS — Z23 Encounter for immunization: Secondary | ICD-10-CM | POA: Insufficient documentation

## 2015-05-15 DIAGNOSIS — Z7982 Long term (current) use of aspirin: Secondary | ICD-10-CM | POA: Insufficient documentation

## 2015-05-15 DIAGNOSIS — Z79899 Other long term (current) drug therapy: Secondary | ICD-10-CM | POA: Diagnosis not present

## 2015-05-15 DIAGNOSIS — S51011A Laceration without foreign body of right elbow, initial encounter: Secondary | ICD-10-CM | POA: Diagnosis not present

## 2015-05-15 DIAGNOSIS — X58XXXA Exposure to other specified factors, initial encounter: Secondary | ICD-10-CM | POA: Insufficient documentation

## 2015-05-15 MED ORDER — LIDOCAINE-EPINEPHRINE (PF) 2 %-1:200000 IJ SOLN
10.0000 mL | Freq: Once | INTRAMUSCULAR | Status: DC
  Filled 2015-05-15: qty 10

## 2015-05-15 MED ORDER — TETANUS-DIPHTH-ACELL PERTUSSIS 5-2.5-18.5 LF-MCG/0.5 IM SUSP
0.5000 mL | Freq: Once | INTRAMUSCULAR | Status: AC
  Administered 2015-05-15: 0.5 mL via INTRAMUSCULAR
  Filled 2015-05-15: qty 0.5

## 2015-05-15 MED ORDER — LIDOCAINE-EPINEPHRINE (PF) 1 %-1:200000 IJ SOLN
10.0000 mL | Freq: Once | INTRAMUSCULAR | Status: AC
  Administered 2015-05-15: 10 mL

## 2015-05-15 MED ORDER — LIDOCAINE-EPINEPHRINE (PF) 1 %-1:200000 IJ SOLN
INTRAMUSCULAR | Status: AC
  Administered 2015-05-15: 10 mL
  Filled 2015-05-15: qty 30

## 2015-05-15 NOTE — ED Notes (Signed)
Pt transported by EMS back to [facility].  Pt's arm wrapped with gauze and 4x4. Some blood stain on dressing but is not actively bleeding at this time.

## 2015-05-15 NOTE — Discharge Instructions (Signed)
Nonsutured Laceration Care °A laceration is a cut that goes through all layers of the skin and extends into the tissue that is right under the skin. This type of cut is usually stitched up (sutured) or closed with tape (adhesive strips) or skin glue shortly after the injury happens. °However, if the wound is dirty or if several hours pass before medical treatment is provided, it is likely that germs (bacteria) will enter the wound. Closing a laceration after bacteria have entered it increases the risk of infection. In these cases, your health care provider may leave the laceration open (nonsutured) and cover it with a bandage. This type of treatment helps prevent infection and allows the wound to heal from the deepest layer of tissue damage up to the surface. °An open fracture is a type of injury that may involve nonsutured lacerations. An open fracture is a break in a bone that happens along with one or more lacerations through the skin that is near the fracture site. °HOW TO CARE FOR YOUR NONSUTURED LACERATION °· Take or apply over-the-counter and prescription medicines only as told by your health care provider. °· If you were prescribed an antibiotic medicine, take or apply it as told by your health care provider. Do not stop using the antibiotic even if your condition improves. °· Clean the wound one time each day or as told by your health care provider. °¨ Wash the wound with mild soap and water. °¨ Rinse the wound with water to remove all soap. °¨ Pat your wound dry with a clean towel. Do not rub the wound. °· Do not inject anything into the wound unless your health care provider told you to. °· Change any bandages (dressings) as told by your health care provider. This includes changing the dressing if it gets wet, dirty, or starts to smell bad. °· Keep the dressing dry until your health care provider says it can be removed. Do not take baths, swim, or do anything that puts your wound underwater until your  health care provider approves. °· Raise (elevate) the injured area above the level of your heart while you are sitting or lying down, if possible. °· Do not scratch or pick at the wound. °· Check your wound every day for signs of infection. Watch for: °¨ Redness, swelling, or pain. °¨ Fluid, blood, or pus. °· Keep all follow-up visits as told by your health care provider. This is important. °SEEK MEDICAL CARE IF: °· You received a tetanus and shot and you have swelling, severe pain, redness, or bleeding at the injection site.   °· You have a fever. °· Your pain is not controlled with medicine. °· You have increased redness, swelling, or pain at the site of your wound. °· You have fluid, blood, or pus coming from your wound. °· You notice a bad smell coming from your wound or your dressing. °· You notice something coming out of the wound, such as wood or glass. °· You notice a change in the color of your skin near your wound. °· You develop a new rash. °· You need to change the dressing frequently due to fluid, blood, or pus draining from the wound. °· You develop numbness around your wound. °SEEK IMMEDIATE MEDICAL CARE IF: °· Your pain suddenly increases and is severe. °· You develop severe swelling around the wound. °· The wound is on your hand or foot and you cannot properly move a finger or toe. °· The wound is on your hand or   foot and you notice that your fingers or toes look pale or bluish. °· You have a red streak going away from your wound. °  °This information is not intended to replace advice given to you by your health care provider. Make sure you discuss any questions you have with your health care provider. °  °Document Released: 03/28/2006 Document Revised: 09/14/2014 Document Reviewed: 04/26/2014 °Elsevier Interactive Patient Education ©2016 Elsevier Inc[employer] ° °

## 2015-05-15 NOTE — ED Notes (Signed)
Spoke with patient's son Roselie Awkward, to give him an update on pt's status. Informed him that EMS will transport patient home.

## 2015-05-15 NOTE — ED Notes (Signed)
Pt arrived via EMS from [facility] after patient was getting out of bed and scratched a mole off of his right arm. Pt is HOH and in NAD. Per EMS pt is able to get up into wheelchair.

## 2015-05-15 NOTE — ED Provider Notes (Signed)
[facility] Emergency Department Provider Note ?  ? ____________________________________________ ? Time seen: 10:45 AM ? I have reviewed the triage vital signs and the nursing notes.  ________ HISTORY ? Chief Complaint Extremity Laceration and Nevus     HPI  Jay Hughes is a 82 y.o. male   who presents emergency department for complaint of a laceration to right elbow. Patient states that he had a previous injury from scratching his arm on his wheelchair. He got up this morning, scratching the area, and pulse. Patient is on blood thinners and states that he had a hard time controlling the bleeding. EMS was called and transported patient to this department. Bleeding was controlled by direct application of pressure and bandaged by EMS. No other injury. No other complaint. Patient is unsure of tetanus status. ? ? ? Past Medical History  Diagnosis Date  . 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 Hypertension     Patient Active Problem List   Diagnosis Date Noted  . HTN (hypertension) 03/13/2015  . Chest pain 03/13/2015  . NSTEMI (non-ST elevated myocardial infarction) ([place]) 03/13/2015  . 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 Arthritis 10/12/2014  . BP (high blood pressure) 10/12/2014  . HLD (hyperlipidemia) 10/12/2014  . Leg weakness 10/12/2014   ? History reviewed. No pertinent past surgical history. ? Current Outpatient Rx  Name  Route  Sig  Dispense  Refill  . aspirin EC 81 MG tablet   Oral   Take 81 mg by mouth daily.         . carbidopa-levodopa (SINEMET IR) 10-100 MG per tablet   Oral   Take 1 tablet by mouth daily.         . clopidogrel (PLAVIX) 75 MG tablet   Oral   Take 1 tablet (75 mg total) by mouth daily.   30 tablet   2   . Cyanocobalamin (RA VITAMIN B-12 TR) 1000 MCG TBCR   Oral   Take 1 tablet by mouth daily.         Marland Kitchen doxazosin (CARDURA) 2 MG tablet   Oral   Take 2 mg by mouth daily.         . isosorbide mononitrate  (IMDUR) 30 MG 24 hr tablet   Oral   Take 1 tablet (30 mg total) by mouth daily.   30 tablet   2   . lisinopril (PRINIVIL,ZESTRIL) 5 MG tablet   Oral   Take 5 mg by mouth daily.         . nabumetone (RELAFEN) 500 MG tablet   Oral   Take 500 mg by mouth daily.         . nitroGLYCERIN (NITROSTAT) 0.4 MG SL tablet   Sublingual   Place 1 tablet (0.4 mg total) under the tongue every 5 (five) minutes as needed for chest pain.   30 tablet   12   . oxybutynin (DITROPAN-XL) 5 MG 24 hr tablet   Oral   Take 5 mg by mouth daily.         . phenytoin (DILANTIN) 100 MG ER capsule   Oral   Take 100 mg by mouth daily.         Marland Kitchen triamcinolone ointment (KENALOG) 0.1 %   Topical   Apply 1 application topically 2 (two) times daily.          ? Allergies Review of patient's allergies indicates no known allergies. ? History reviewed. No pertinent family history. ? Social History Social History  Substance Use Topics  . Smoking status: Former Research scientist (life sciences)  . Smokeless tobacco: None  . Alcohol Use: No   ? Review of Systems Constitutional: no fever. Eyes: no discharge ENT: no sore throat. Cardiovascular: no chest pain. Respiratory: no cough. No sob Gastrointestinal: denies abdominal pain, vomiting, diarrhea, and constipation Genitourinary: no dysuria. Negative for hematuria Musculoskeletal: Negative for back pain. Skin: Negative for rash. Nurse's laceration to right elbow. Neurological: Negative for headaches  10-point ROS otherwise negative.  _______________ PHYSICAL EXAM: ? VITAL SIGNS:   ED Triage Vitals  Enc Vitals Group     BP 05/15/15 0834 164/72 mmHg     Pulse Rate 05/15/15 0834 77     Resp 05/15/15 0834 18     Temp 05/15/15 0834 97.8 F (36.6 C)     Temp Source 05/15/15 0834 Oral     SpO2 05/15/15 0834 96 %     Weight 05/15/15 0834 185 lb (83.915 kg)     Height 05/15/15 0834 5\' 9"  (1.753 m)     Head Cir --      Peak Flow --      Pain Score 05/15/15 0834 0      Pain Loc --      Pain Edu? --      Excl. in [place]? --    ?  Constitutional: Alert and oriented. Well appearing and in no distress. Eyes: Conjunctivae are normal.  ENT      Head: Normocephalic and atraumatic.      Ears:       Nose: No congestion/rhinnorhea.      Mouth/Throat: Mucous membranes are moist.   Hematological/Lymphatic/Immunilogical: No cervical lymphadenopathy. Cardiovascular: Normal rate, regular rhythm. Normal S1 and S2. Respiratory: Normal respiratory effort without tachypnea nor retractions. Lungs CTAB. Gastrointestinal: Soft and nontender. No distention. There is no CVA tenderness. Genitourinary:  Musculoskeletal: Nontender with normal range of motion in all extremities.  Neurologic:  Normal speech and language. No gross focal neurologic deficits are appreciated. Skin:  Skin is warm, dry and intact. No rash noted. laceration noted to right elbow. There is an underlying skin lesion which appears to be a nevus in the area. Bleeding is controlled. No visible foreign body. Radial pulse and capillary refill was present distally.  Psychiatric: Mood and affect are normal. Speech and behavior are normal. Patient exhibits appropriate insight and judgment.    LABS (all labs ordered are listed, but only abnormal results are displayed)  Labs Reviewed - No data to display  ___________ RADIOLOGY    _____________ PROCEDURES ? Procedure(s) performed:    LACERATION REPAIR Performed by: Darletta Moll Authorized by: Charline Bills Rhythm Wigfall Consent: Verbal consent obtained. Risks and benefits: risks, benefits and alternatives were discussed Consent given by: patient Patient identity confirmed: provided demographic data Prepped and Draped in normal sterile fashion Wound explored  Laceration Location: Right elbow  Laceration Length: 1 cm  No Foreign Bodies seen or palpated  Anesthesia: local infiltration  Local anesthetic: lidocaine 1 % with  epinephrine  Anesthetic total: 5 ml  Irrigation method: syringe Amount of cleaning: standard    Technique: Area was thoroughly irrigated, cleansed, and explored. No visible foreign body. Local anesthetic is applied. There is a skin lesion that appears to be a nevus. Laceration/abrasion is underlying this flap. It is removed using a 10 blade.  clean. Bleeding is well-controlled at this time. Surgicel is applied and covered with gauze. Patient tolerated procedure well. Radial pulses and sensation to all 5 digits are appreciated  status post procedure.   Patient tolerance: Patient tolerated the procedure well with no immediate complications.     Medications  lidocaine-EPINEPHrine (XYLOCAINE-EPINEPHrine) 1 %-1:200000 (PF) injection 10 mL (not administered)  Tdap (BOOSTRIX) injection 0.5 mL (0.5 mLs Intramuscular Given 05/15/15 0916)    ______________________________________________________ INITIAL IMPRESSION / ASSESSMENT AND PLAN / ED COURSE ? Pertinent labs & imaging results that were available during my care of the patient were reviewed by me and considered in my medical decision making (see chart for details).     Asian presents emergency Department with a abrasion/laceration to right elbow. This is underlying a skin nevus. Uses removed here in the emergency department. Underlying affected area is more of an abrasion versus laceration. Bleeding is well controlled. Surgicel is applied to the area and covered with gauze. Patient tolerated procedure well. Patient will be discharged back to nursing facility. Patient is given discharge instructions on wound care.    New Prescriptions   No medications on file   ____________________________________________ FINAL CLINICAL IMPRESSION(S) / ED DIAGNOSES?  Final diagnoses:  Laceration of elbow, right, initial encounter            Darletta Moll, PA-C 05/15/15 1046  Daymon Larsen, MD 05/15/15 [phone]

## 2015-10-12 ENCOUNTER — Other Ambulatory Visit: Payer: Medicare Other

## 2015-10-12 ENCOUNTER — Ambulatory Visit: Payer: Medicare Other | Admitting: Oncology

## 2015-10-12 ENCOUNTER — Inpatient Hospital Stay: Payer: Medicare Other | Attending: Family Medicine

## 2015-10-12 ENCOUNTER — Inpatient Hospital Stay (HOSPITAL_BASED_OUTPATIENT_CLINIC_OR_DEPARTMENT_OTHER): Payer: Medicare Other | Admitting: Family Medicine

## 2015-10-12 VITALS — BP 98/66 | HR 82 | Temp 96.0°F | Resp 18 | Ht 69.0 in | Wt 165.0 lb

## 2015-10-12 DIAGNOSIS — I10 Essential (primary) hypertension: Secondary | ICD-10-CM | POA: Diagnosis not present

## 2015-10-12 DIAGNOSIS — Z87891 Personal history of nicotine dependence: Secondary | ICD-10-CM

## 2015-10-12 DIAGNOSIS — D72820 Lymphocytosis (symptomatic): Secondary | ICD-10-CM | POA: Diagnosis present

## 2015-10-12 DIAGNOSIS — Z7982 Long term (current) use of aspirin: Secondary | ICD-10-CM | POA: Diagnosis not present

## 2015-10-12 DIAGNOSIS — Z79899 Other long term (current) drug therapy: Secondary | ICD-10-CM | POA: Insufficient documentation

## 2015-10-12 LAB — COMPREHENSIVE METABOLIC PANEL
ALT: 11 U/L — AB (ref 17–63)
AST: 16 U/L (ref 15–41)
Albumin: 3.5 g/dL (ref 3.5–5.0)
Alkaline Phosphatase: 87 U/L (ref 38–126)
Anion gap: 6 (ref 5–15)
BUN: 30 mg/dL — AB (ref 6–20)
CHLORIDE: 106 mmol/L (ref 101–111)
CO2: 25 mmol/L (ref 22–32)
CREATININE: 1.17 mg/dL (ref 0.61–1.24)
Calcium: 8.6 mg/dL — ABNORMAL LOW (ref 8.9–10.3)
GFR calc Af Amer: 59 mL/min — ABNORMAL LOW (ref >60)
GFR, EST NON AFRICAN AMERICAN: 51 mL/min — AB (ref >60)
Glucose, Bld: 126 mg/dL — ABNORMAL HIGH (ref 65–99)
Potassium: 4.8 mmol/L (ref 3.5–5.1)
Sodium: 137 mmol/L (ref 135–145)
Total Bilirubin: 0.8 mg/dL (ref 0.3–1.2)
Total Protein: 7 g/dL (ref 6.5–8.1)

## 2015-10-12 LAB — CBC WITH DIFFERENTIAL/PLATELET
BASOS ABS: 0.1 10*3/uL (ref 0–0.1)
Basophils Relative: 1 %
EOS PCT: 2 %
Eosinophils Absolute: 0.1 10*3/uL (ref 0–0.7)
HEMATOCRIT: 37.8 % — AB (ref 40.0–52.0)
Hemoglobin: 12.6 g/dL — ABNORMAL LOW (ref 13.0–18.0)
Lymphocytes Relative: 18 %
Lymphs Abs: 1.1 10*3/uL (ref 1.0–3.6)
MCH: 28.4 pg (ref 26.0–34.0)
MCHC: 33.4 g/dL (ref 32.0–36.0)
MCV: 84.9 fL (ref 80.0–100.0)
Monocytes Absolute: 0.6 10*3/uL (ref 0.2–1.0)
Monocytes Relative: 10 %
NEUTROS PCT: 69 %
Neutro Abs: 4.4 10*3/uL (ref 1.4–6.5)
PLATELETS: 204 10*3/uL (ref 150–440)
RBC: 4.45 MIL/uL (ref 4.40–5.90)
RDW: 14.9 % — ABNORMAL HIGH (ref 11.5–14.5)
WBC: 6.4 10*3/uL (ref 3.8–10.6)

## 2015-10-12 NOTE — Progress Notes (Signed)
Per pt every now and again he has constipation.  Stays at [facility].  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  Pt has no over all concerns just a tad weaker all the time.

## 2015-10-12 NOTE — Progress Notes (Signed)
Jay Hughes  [phone]  [phone]     Jay Hughes DOB: June 23, 1916  MR#: [id number]  TMA#:[id number]  Patient Care Team: [name], MD as PCP - General (Internal Medicine)  CHIEF COMPLAINT:  Chief Complaint  Patient presents with  . Follow-up    Atypical lymphocytosis    INTERVAL HISTORY:  Patient is here for further follow-up regarding a right paramedian soft tissue mass in the oropharynx. Patient had a biopsy with Dr. [name], ENT that was inconclusive. Patient is here for yearly follow-up. He is a resident of the [facility] assisted [facility] facility. He overall reports feeling very well. He denies any complaints of fever, chills, night sweats. Denies any issues with swallowing, pain in the right paramedian area. He is asking if he needs to return for further follow-up as he is almost 82 years old.  REVIEW OF SYSTEMS:   Review of Systems  Constitutional: Positive for malaise/fatigue. Negative for fever, chills, weight loss and diaphoresis.  HENT: Negative.   Eyes: Negative.   Respiratory: Negative for cough, hemoptysis, sputum production, shortness of breath and wheezing.   Cardiovascular: Negative for chest pain, palpitations, orthopnea, claudication, leg swelling and PND.  Gastrointestinal: Negative for heartburn, nausea, vomiting, abdominal pain, diarrhea, constipation, blood in stool and melena.  Genitourinary: Negative.   Musculoskeletal: Negative.   Skin: Negative.   Neurological: Positive for weakness. Negative for dizziness, tingling, focal weakness and seizures.  Endo/Heme/Allergies: Does not bruise/bleed easily.  Psychiatric/Behavioral: Negative for depression. The patient is not nervous/anxious and does not have insomnia.     As per HPI. Otherwise, a complete review of systems is negatve.  ONCOLOGY HISTORY: 1. Right paramedian soft tissue mass, cystic in nature. Initial needle biopsy revealed atypical lymphocytosis  raising concerned regarding lymphoma (November, 2015) biopsy was done in Dr. Reola Mosher office. 2. PET scan which has been done in November of 2015 was inconclusive  PAST MEDICAL HISTORY: Past Medical History  Diagnosis Date  . 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 Hypertension     PAST SURGICAL HISTORY: No past surgical history on file.  FAMILY HISTORY No family history on file.  GYNECOLOGIC HISTORY:  No LMP for male patient.     ADVANCED DIRECTIVES:    HEALTH MAINTENANCE: Social History  Substance Use Topics  . Smoking status: Former Research scientist (life sciences)  . Smokeless tobacco: Not on file  . Alcohol Use: No    No Known Allergies  Current Outpatient Prescriptions  Medication Sig Dispense Refill  . aspirin EC 81 MG tablet Take 81 mg by mouth daily.    . carbidopa-levodopa (SINEMET IR) 10-100 MG per tablet Take 1 tablet by mouth daily.    . Cholecalciferol (VITAMIN D3) 1000 units CAPS Take 2,000 Units by mouth daily.    . clopidogrel (PLAVIX) 75 MG tablet Take 1 tablet (75 mg total) by mouth daily. 30 tablet 2  . doxazosin (CARDURA) 2 MG tablet Take 2 mg by mouth daily.    . isosorbide mononitrate (IMDUR) 30 MG 24 hr tablet Take 1 tablet (30 mg total) by mouth daily. 30 tablet 2  . nabumetone (RELAFEN) 500 MG tablet Take 500 mg by mouth daily.    Marland Kitchen oxybutynin (DITROPAN-XL) 5 MG 24 hr tablet Take 5 mg by mouth daily.    . phenytoin (DILANTIN) 100 MG ER capsule Take 100 mg by mouth daily.    Marland Kitchen triamcinolone ointment (KENALOG) 0.1 % Apply 1 application topically 2 (two) times daily.    . nitroGLYCERIN (NITROSTAT) 0.4 MG  SL tablet Place 1 tablet (0.4 mg total) under the tongue every 5 (five) minutes as needed for chest pain. (Patient not taking: Reported on 10/12/2015) 30 tablet 12   No current facility-administered medications for this visit.    OBJECTIVE: BP 98/66 mmHg  Pulse 82  Temp(Src) 96 F (35.6 C) (Tympanic)  Resp 18  Ht 5' 9"  (1.753 m)  Wt 165 lb (74.844 kg)  BMI 24.36 kg/m2    Body mass index is 24.36 kg/(m^2).    ECOG FS:2 - Symptomatic, <50% confined to bed  General: Well-developed, well-nourished, no acute distress. Patient is in motorized wheelchair, requires assistance for transfers. Eyes: Pink conjunctiva, anicteric sclera. HEENT: Normocephalic, moist mucous membranes, clear oropharnyx.  Lungs: Clear to auscultation bilaterally. Heart: Regular rate and rhythm. No rubs, murmurs, or gallops. Musculoskeletal: No edema, cyanosis, or clubbing. Neuro: Alert, answering all questions appropriately. Cranial nerves grossly intact. Skin: No rashes or petechiae noted. Psych: Normal affect. Lymphatics: No cervical, clavicular LAD.   LAB RESULTS:  Appointment on 10/12/2015  Component Date Value Ref Range Status  . WBC 10/12/2015 6.4  3.8 - 10.6 K/uL Final  . RBC 10/12/2015 4.45  4.40 - 5.90 MIL/uL Final  . Hemoglobin 10/12/2015 12.6* 13.0 - 18.0 g/dL Final  . HCT 10/12/2015 37.8* 40.0 - 52.0 % Final  . MCV 10/12/2015 84.9  80.0 - 100.0 fL Final  . MCH 10/12/2015 28.4  26.0 - 34.0 pg Final  . MCHC 10/12/2015 33.4  32.0 - 36.0 g/dL Final  . RDW 10/12/2015 14.9* 11.5 - 14.5 % Final  . Platelets 10/12/2015 204  150 - 440 K/uL Final  . Neutrophils Relative % 10/12/2015 69   Final  . Neutro Abs 10/12/2015 4.4  1.4 - 6.5 K/uL Final  . Lymphocytes Relative 10/12/2015 18   Final  . Lymphs Abs 10/12/2015 1.1  1.0 - 3.6 K/uL Final  . Monocytes Relative 10/12/2015 10   Final  . Monocytes Absolute 10/12/2015 0.6  0.2 - 1.0 K/uL Final  . Eosinophils Relative 10/12/2015 2   Final  . Eosinophils Absolute 10/12/2015 0.1  0 - 0.7 K/uL Final  . Basophils Relative 10/12/2015 1   Final  . Basophils Absolute 10/12/2015 0.1  0 - 0.1 K/uL Final  . Sodium 10/12/2015 137  135 - 145 mmol/L Final  . Potassium 10/12/2015 4.8  3.5 - 5.1 mmol/L Final  . Chloride 10/12/2015 106  101 - 111 mmol/L Final  . CO2 10/12/2015 25  22 - 32 mmol/L Final  . Glucose, Bld 10/12/2015 126* 65 - 99  mg/dL Final  . BUN 10/12/2015 30* 6 - 20 mg/dL Final  . Creatinine, Ser 10/12/2015 1.17  0.61 - 1.24 mg/dL Final  . Calcium 10/12/2015 8.6* 8.9 - 10.3 mg/dL Final  . Total Protein 10/12/2015 7.0  6.5 - 8.1 g/dL Final  . Albumin 10/12/2015 3.5  3.5 - 5.0 g/dL Final  . AST 10/12/2015 16  15 - 41 U/L Final  . ALT 10/12/2015 11* 17 - 63 U/L Final  . Alkaline Phosphatase 10/12/2015 87  38 - 126 U/L Final  . Total Bilirubin 10/12/2015 0.8  0.3 - 1.2 mg/dL Final  . GFR calc non Af Amer 10/12/2015 51* >60 mL/min Final  . GFR calc Af Amer 10/12/2015 59* >60 mL/min Final   Comment: (NOTE) The eGFR has been calculated using the CKD EPI equation. This calculation has not been validated in all clinical situations. eGFR's persistently <60 mL/min signify possible Chronic Kidney Disease.   Marland Kitchen  Anion gap 10/12/2015 6  5 - 15 Final    STUDIES: No results found.  ASSESSMENT:  Right paramedian soft tissue mass in oropharynx.  PLAN:   1. Soft tissue mass in oropharynx. Patient's initial biopsy revealed atypical lymphocytosis raising concern for her low-grade lymphoma. Biopsy was performed and Dr. Reola Mosher office in November 2015 patient also had a PET scan which was performed in November 2015 which was also inconclusive. Patient reports that the soft tissue mass is no longer present and also denies any difficulty with swallowing, pain, or chewing. He is requesting that he no longer has to come into this office for follow-up. He continues to follow closely with his primary care provider. Advised patient that if the need arose or if you noticed any difficulty swallowing, chewing, pain, or excessive bleeding to contact our office for further follow-up.  Patient expressed understanding and was in agreement with this plan. He also understands that He can call clinic at any time with any questions, concerns, or complaints.   Dr. Oliva Bustard was available for consultation and review of plan of care for this  patient.  Evlyn Kanner, NP   10/12/2015 2:10 PM

## 2015-11-09 IMAGING — CR DG CHEST 1V PORT
1 series · 1 of 1 positions shown · non-contrast
Comparison: Portable chest x-ray dated June 12, 2009

CLINICAL DATA: Abdominal pain and vomiting.

EXAM:
PORTABLE CHEST - 1 VIEW

[ap]
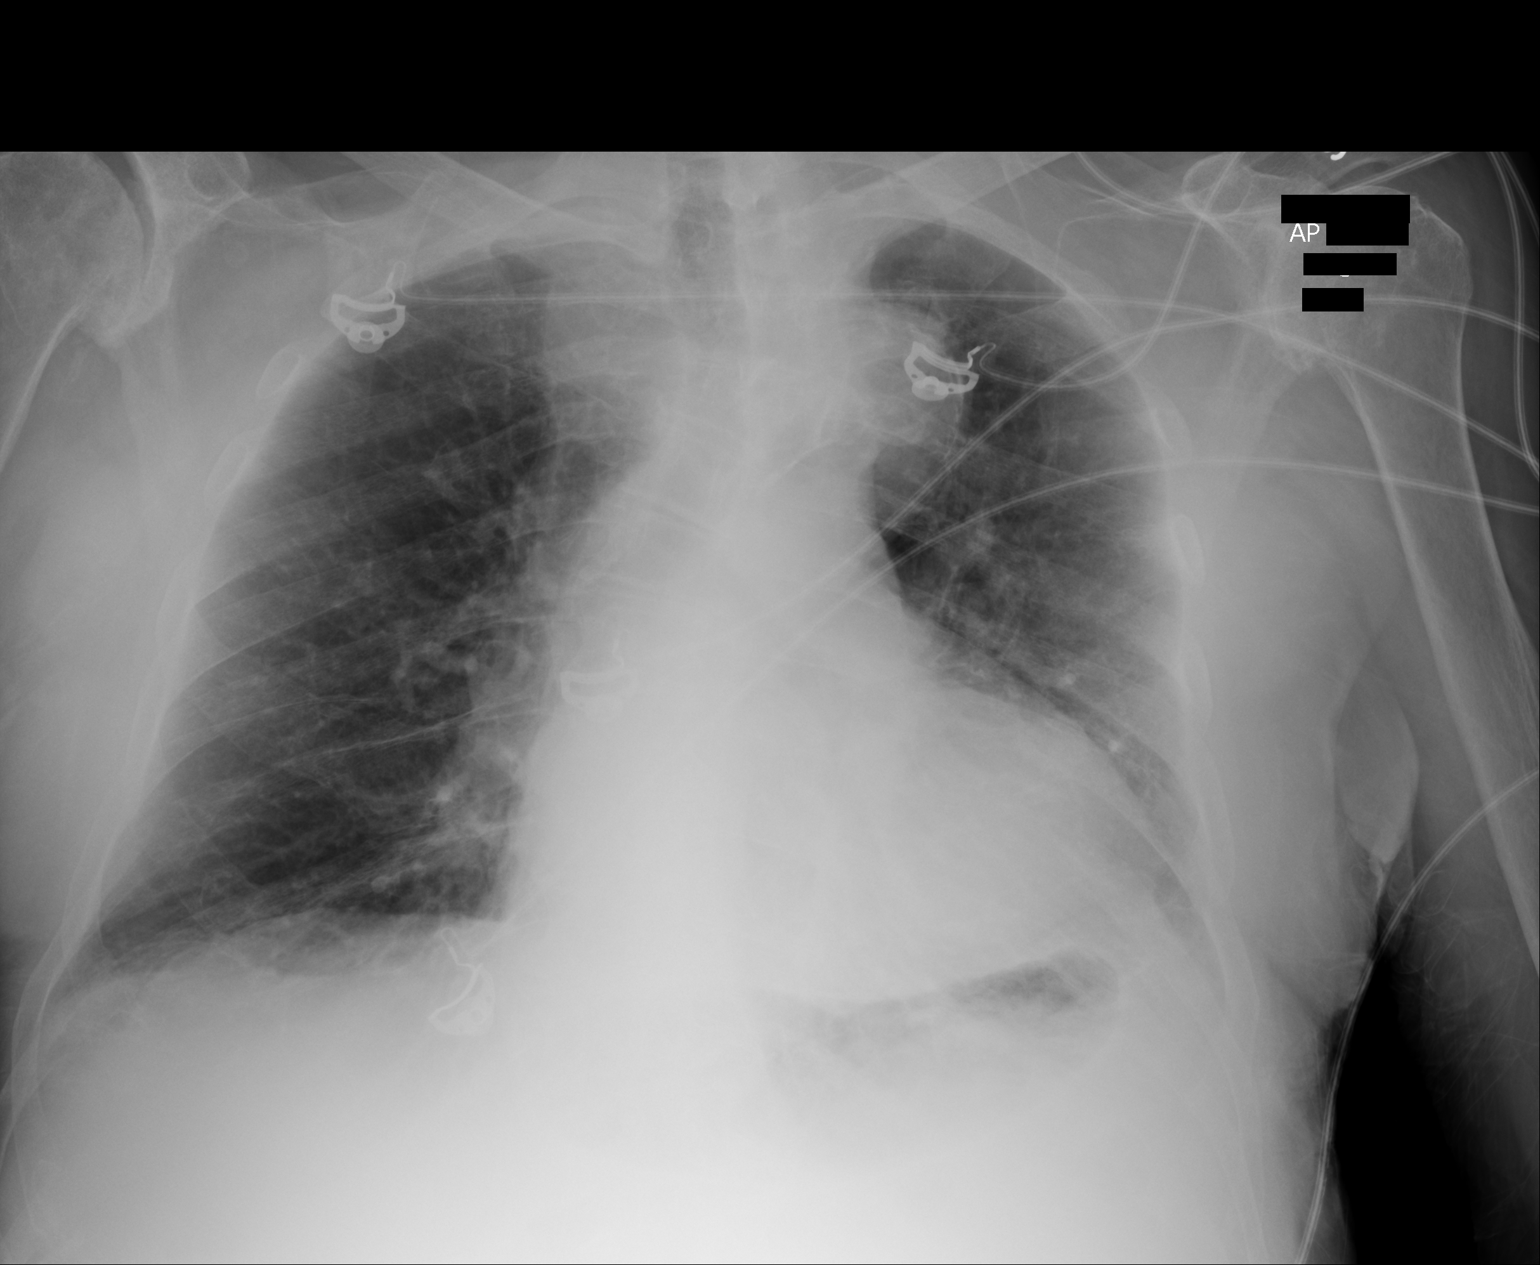

[1 of 1 positions shown; findings below may reference images not displayed]

FINDINGS: The right lung is adequately inflated and clear. On the left there
is chronic partial obscuration of the hemidiaphragm. There is no
alveolar infiltrate. The cardiac silhouette is mildly enlarged but
stable. There is stable tortuosity of the ascending and descending
thoracic aorta. There is stable soft tissue prominence in the
paratracheal regions superiorly. There is degenerative change of
both shoulders and there is stable mid thoracic dextroscoliosis.
IMPRESSION: There is no acute cardiopulmonary abnormality. Chronic enlargement
of the cardiac silhouette without pulmonary vascular congestion.

## 2015-11-10 IMAGING — CR DG CHEST 2V
1 series · 3 of 3 positions shown · non-contrast
Comparison: 11/25/2013; 06/12/2009

CLINICAL DATA: Cough, nausea and vomiting, abdominal pain

EXAM:
CHEST  2 VIEW

[Series 1: x chest ap · 0.14mm/px · 3 of 3 slices shown]
[im 1/3]
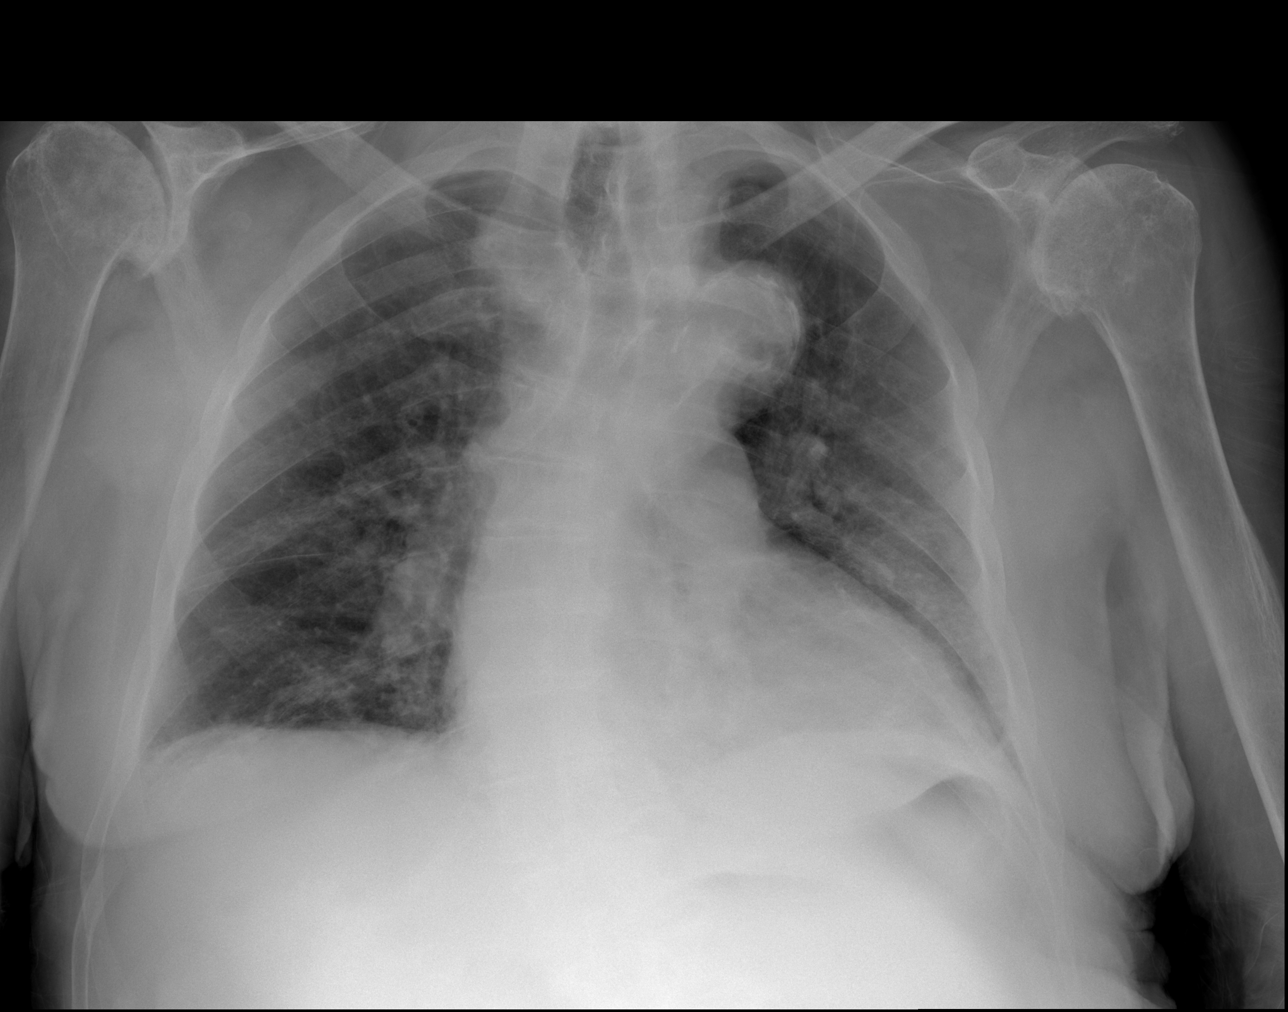
[im 2/3]
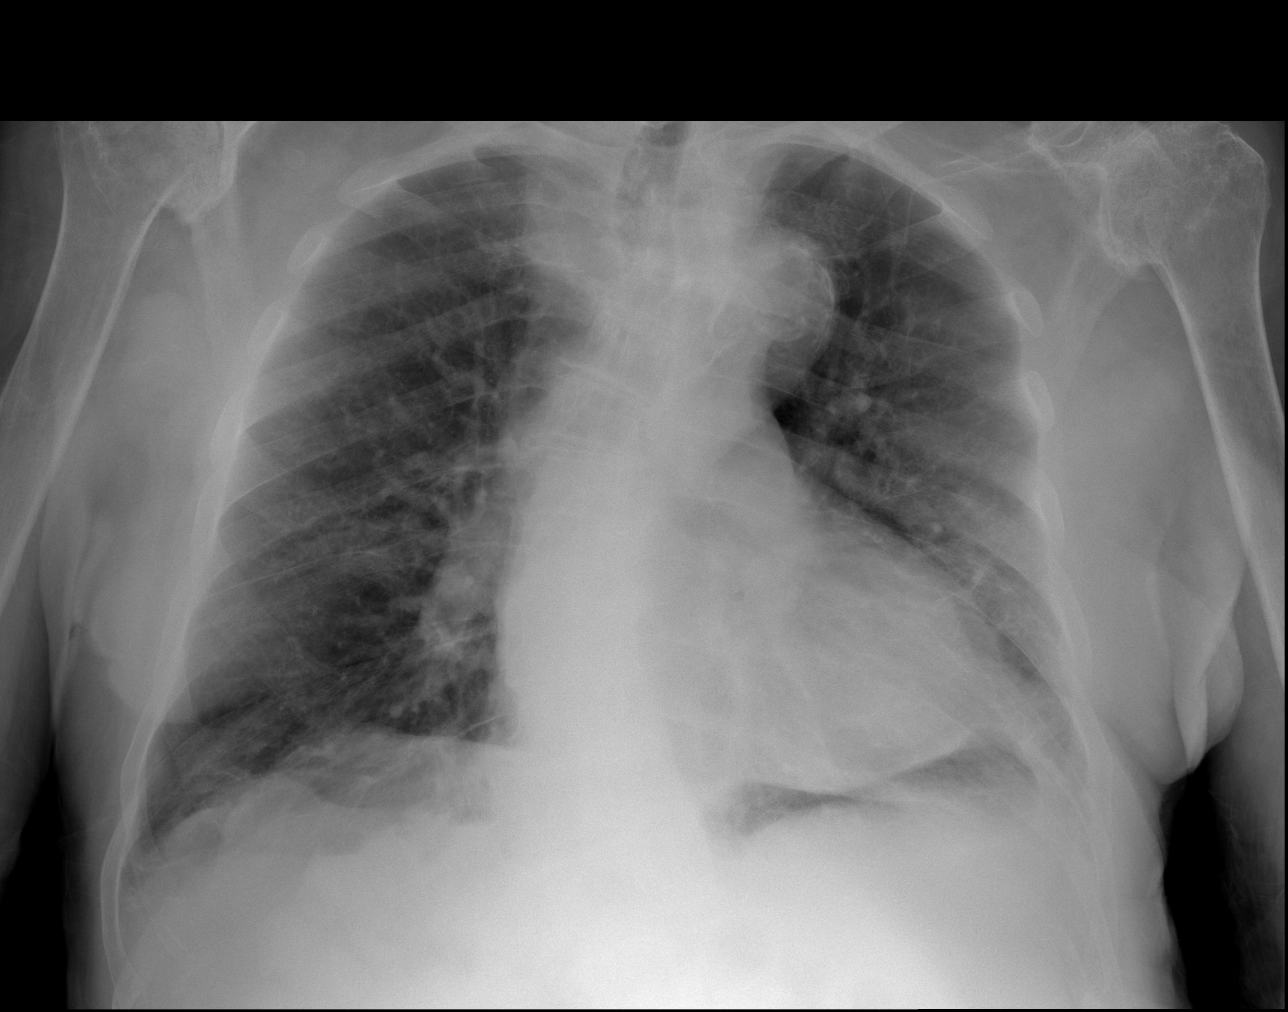
[im 3/3]
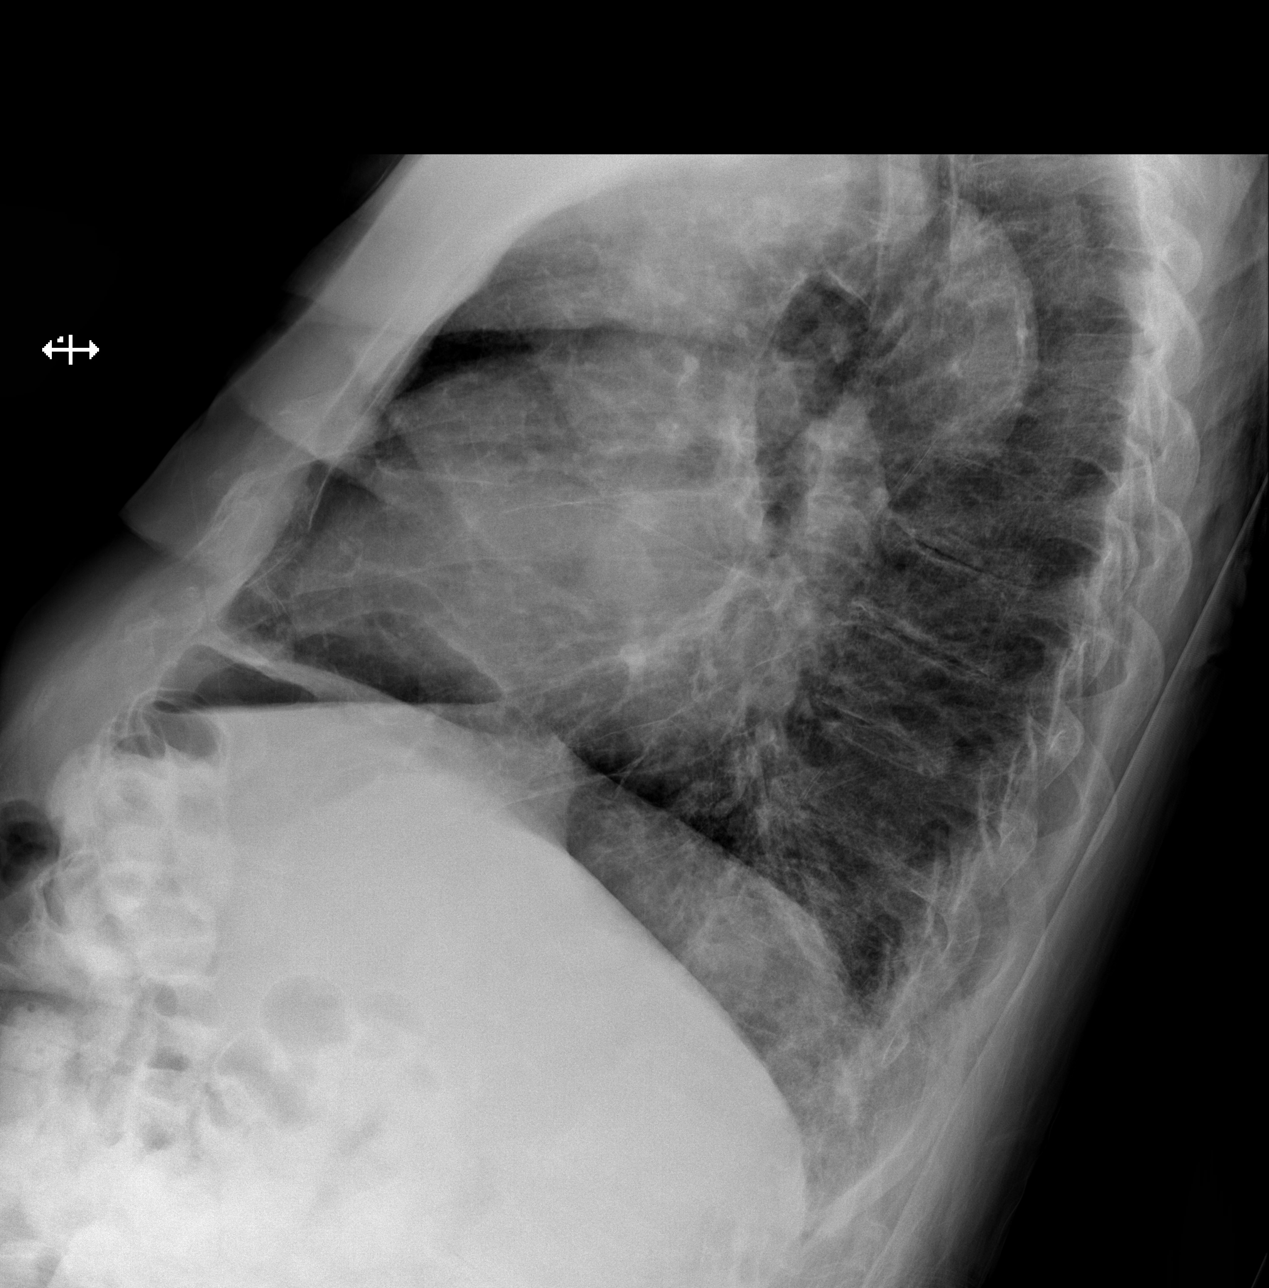

[3 of 3 positions shown; findings below may reference images not displayed]

FINDINGS: Grossly unchanged enlarged cardiac silhouette and mediastinal
contours with atherosclerotic plaque within the thoracic aorta.
There is persistent thickening of the right paratracheal stripe,
unchanged since the [DATE] examination and favored to be secondary
to prominent vascularity. There is persistent leftward deviation of
the tracheal air, the level thoracic inlet also unchanged.

There is grossly unchanged mild diffuse slightly nodular thickening
of the pulmonary interstitium. No discrete focal airspace opacities.
No pleural effusion or pneumothorax. No evidence of edema. Grossly
unchanged bones including advanced degenerative change of the
bilateral glenohumeral joints.
IMPRESSION: Hyperexpanded lungs and bronchitic change without acute
cardiopulmonary disease.

## 2015-12-04 ENCOUNTER — Emergency Department: Payer: Medicare Other

## 2015-12-04 ENCOUNTER — Observation Stay
Admit: 2015-12-04 | Discharge: 2015-12-04 | Disposition: A | Discharge status: Home / Self Care | Payer: Medicare Other | Attending: Internal Medicine | Admitting: Internal Medicine

## 2015-12-04 ENCOUNTER — Observation Stay
Admission: EM | Admit: 2015-12-04 | Discharge: 2015-12-05 | Payer: Medicare Other | Attending: Internal Medicine | Admitting: Internal Medicine

## 2015-12-04 ENCOUNTER — Encounter: Payer: Self-pay | Admitting: Emergency Medicine

## 2015-12-04 DIAGNOSIS — R1110 Vomiting, unspecified: Secondary | ICD-10-CM | POA: Diagnosis not present

## 2015-12-04 DIAGNOSIS — Z7982 Long term (current) use of aspirin: Secondary | ICD-10-CM | POA: Insufficient documentation

## 2015-12-04 DIAGNOSIS — R4182 Altered mental status, unspecified: Secondary | ICD-10-CM | POA: Diagnosis present

## 2015-12-04 DIAGNOSIS — E860 Dehydration: Secondary | ICD-10-CM | POA: Diagnosis not present

## 2015-12-04 DIAGNOSIS — Z87891 Personal history of nicotine dependence: Secondary | ICD-10-CM | POA: Insufficient documentation

## 2015-12-04 DIAGNOSIS — R748 Abnormal levels of other serum enzymes: Secondary | ICD-10-CM | POA: Insufficient documentation

## 2015-12-04 DIAGNOSIS — R5383 Other fatigue: Secondary | ICD-10-CM

## 2015-12-04 DIAGNOSIS — D72820 Lymphocytosis (symptomatic): Secondary | ICD-10-CM | POA: Insufficient documentation

## 2015-12-04 DIAGNOSIS — Z7902 Long term (current) use of antithrombotics/antiplatelets: Secondary | ICD-10-CM | POA: Diagnosis not present

## 2015-12-04 DIAGNOSIS — Z79899 Other long term (current) drug therapy: Secondary | ICD-10-CM | POA: Insufficient documentation

## 2015-12-04 DIAGNOSIS — R778 Other specified abnormalities of plasma proteins: Secondary | ICD-10-CM

## 2015-12-04 DIAGNOSIS — Z66 Do not resuscitate: Secondary | ICD-10-CM | POA: Diagnosis not present

## 2015-12-04 DIAGNOSIS — I700 Atherosclerosis of aorta: Secondary | ICD-10-CM | POA: Insufficient documentation

## 2015-12-04 DIAGNOSIS — R7989 Other specified abnormal findings of blood chemistry: Secondary | ICD-10-CM

## 2015-12-04 DIAGNOSIS — I119 Hypertensive heart disease without heart failure: Secondary | ICD-10-CM | POA: Diagnosis not present

## 2015-12-04 DIAGNOSIS — I517 Cardiomegaly: Secondary | ICD-10-CM | POA: Insufficient documentation

## 2015-12-04 DIAGNOSIS — I252 Old myocardial infarction: Secondary | ICD-10-CM | POA: Diagnosis not present

## 2015-12-04 DIAGNOSIS — B349 Viral infection, unspecified: Secondary | ICD-10-CM | POA: Insufficient documentation

## 2015-12-04 DIAGNOSIS — Z791 Long term (current) use of non-steroidal anti-inflammatories (NSAID): Secondary | ICD-10-CM | POA: Insufficient documentation

## 2015-12-04 DIAGNOSIS — I10 Essential (primary) hypertension: Secondary | ICD-10-CM

## 2015-12-04 DIAGNOSIS — L8990 Pressure ulcer of unspecified site, unspecified stage: Secondary | ICD-10-CM | POA: Insufficient documentation

## 2015-12-04 DIAGNOSIS — H9190 Unspecified hearing loss, unspecified ear: Secondary | ICD-10-CM | POA: Insufficient documentation

## 2015-12-04 DIAGNOSIS — R112 Nausea with vomiting, unspecified: Secondary | ICD-10-CM

## 2015-12-04 LAB — CBC
HEMATOCRIT: 41.4 % (ref 40.0–52.0)
Hemoglobin: 14 g/dL (ref 13.0–18.0)
MCH: 29 pg (ref 26.0–34.0)
MCHC: 33.7 g/dL (ref 32.0–36.0)
MCV: 85.9 fL (ref 80.0–100.0)
Platelets: 206 10*3/uL (ref 150–440)
RBC: 4.82 MIL/uL (ref 4.40–5.90)
RDW: 14.8 % — AB (ref 11.5–14.5)
WBC: 12.1 10*3/uL — ABNORMAL HIGH (ref 3.8–10.6)

## 2015-12-04 LAB — LACTIC ACID, PLASMA
LACTIC ACID, VENOUS: 1.3 mmol/L (ref 0.5–1.9)
Lactic Acid, Venous: 1.6 mmol/L (ref 0.5–1.9)

## 2015-12-04 LAB — HEPATIC FUNCTION PANEL
ALBUMIN: 3.7 g/dL (ref 3.5–5.0)
ALK PHOS: 95 U/L (ref 38–126)
ALT: 9 U/L — ABNORMAL LOW (ref 17–63)
AST: 21 U/L (ref 15–41)
Bilirubin, Direct: <0.1 mg/dL — ABNORMAL LOW (ref 0.1–0.5)
Total Bilirubin: 0.7 mg/dL (ref 0.3–1.2)
Total Protein: 7.5 g/dL (ref 6.5–8.1)

## 2015-12-04 LAB — URINALYSIS COMPLETE WITH MICROSCOPIC (ARMC ONLY)
Bilirubin Urine: NEGATIVE
GLUCOSE, UA: NEGATIVE mg/dL
HGB URINE DIPSTICK: NEGATIVE
Leukocytes, UA: NEGATIVE
Nitrite: NEGATIVE
PROTEIN: NEGATIVE mg/dL
Specific Gravity, Urine: 1.017 (ref 1.005–1.030)
Squamous Epithelial / LPF: NONE SEEN
WBC UA: NONE SEEN WBC/hpf (ref 0–5)
pH: 5 (ref 5.0–8.0)

## 2015-12-04 LAB — BASIC METABOLIC PANEL
Anion gap: 11 (ref 5–15)
BUN: 32 mg/dL — AB (ref 6–20)
CHLORIDE: 105 mmol/L (ref 101–111)
CO2: 21 mmol/L — AB (ref 22–32)
Calcium: 9 mg/dL (ref 8.9–10.3)
Creatinine, Ser: 1.09 mg/dL (ref 0.61–1.24)
GFR calc Af Amer: >60 mL/min (ref >60)
GFR calc non Af Amer: 55 mL/min — ABNORMAL LOW (ref >60)
GLUCOSE: 160 mg/dL — AB (ref 65–99)
POTASSIUM: 4.1 mmol/L (ref 3.5–5.1)
Sodium: 137 mmol/L (ref 135–145)

## 2015-12-04 LAB — TROPONIN I
TROPONIN I: 0.65 ng/mL — AB (ref <0.03)
Troponin I: 0.23 ng/mL (ref <0.03)
Troponin I: 0.54 ng/mL (ref <0.03)

## 2015-12-04 LAB — LIPASE, BLOOD: LIPASE: 13 U/L (ref 11–51)

## 2015-12-04 LAB — PHENYTOIN LEVEL, TOTAL

## 2015-12-04 MED ORDER — HYDRALAZINE HCL 20 MG/ML IJ SOLN
10.0000 mg | INTRAMUSCULAR | Status: DC | PRN
  Administered 2015-12-04: 10 mg via INTRAVENOUS
  Filled 2015-12-04: qty 1

## 2015-12-04 MED ORDER — SODIUM CHLORIDE 0.9 % IV SOLN
INTRAVENOUS | Status: DC
  Administered 2015-12-04: 17:00:00 via INTRAVENOUS

## 2015-12-04 MED ORDER — ONDANSETRON HCL 4 MG/2ML IJ SOLN: 4.0000 mg | Freq: Four times a day (QID) | INTRAMUSCULAR | Status: DC | PRN

## 2015-12-04 MED ORDER — HEPARIN SODIUM (PORCINE) 5000 UNIT/ML IJ SOLN
5000.0000 [IU] | Freq: Three times a day (TID) | INTRAMUSCULAR | Status: DC
  Administered 2015-12-04 – 2015-12-05 (×3): 5000 [IU] via SUBCUTANEOUS
  Filled 2015-12-04 (×3): qty 1

## 2015-12-04 MED ORDER — OXYBUTYNIN CHLORIDE ER 5 MG PO TB24
5.0000 mg | ORAL_TABLET | Freq: Every day | ORAL | Status: DC
  Administered 2015-12-05: 5 mg via ORAL
  Filled 2015-12-04: qty 1

## 2015-12-04 MED ORDER — ACETAMINOPHEN 650 MG RE SUPP: 650.0000 mg | Freq: Four times a day (QID) | RECTAL | Status: DC | PRN

## 2015-12-04 MED ORDER — PHENYTOIN SODIUM EXTENDED 100 MG PO CAPS
100.0000 mg | ORAL_CAPSULE | Freq: Every day | ORAL | Status: DC
  Administered 2015-12-05: 100 mg via ORAL
  Filled 2015-12-04 (×3): qty 1

## 2015-12-04 MED ORDER — ISOSORBIDE MONONITRATE ER 30 MG PO TB24
30.0000 mg | ORAL_TABLET | Freq: Two times a day (BID) | ORAL | Status: DC
  Administered 2015-12-04 – 2015-12-05 (×2): 30 mg via ORAL
  Filled 2015-12-04 (×2): qty 1

## 2015-12-04 MED ORDER — PIPERACILLIN-TAZOBACTAM 3.375 G IVPB
3.3750 g | Freq: Three times a day (TID) | INTRAVENOUS | Status: DC
  Administered 2015-12-04 – 2015-12-05 (×3): 3.375 g via INTRAVENOUS
  Filled 2015-12-04 (×4): qty 50

## 2015-12-04 MED ORDER — DOCUSATE SODIUM 100 MG PO CAPS
100.0000 mg | ORAL_CAPSULE | Freq: Two times a day (BID) | ORAL | Status: DC
  Administered 2015-12-04 – 2015-12-05 (×2): 100 mg via ORAL
  Filled 2015-12-04 (×2): qty 1

## 2015-12-04 MED ORDER — MORPHINE SULFATE (PF) 2 MG/ML IV SOLN: 2.0000 mg | INTRAVENOUS | Status: DC | PRN

## 2015-12-04 MED ORDER — DOXAZOSIN MESYLATE 4 MG PO TABS
2.0000 mg | ORAL_TABLET | Freq: Every day | ORAL | Status: DC
  Administered 2015-12-05: 2 mg via ORAL
  Filled 2015-12-04: qty 1

## 2015-12-04 MED ORDER — ONDANSETRON HCL 4 MG PO TABS: 4.0000 mg | ORAL_TABLET | Freq: Four times a day (QID) | ORAL | Status: DC | PRN

## 2015-12-04 MED ORDER — PIPERACILLIN-TAZOBACTAM 3.375 G IVPB 30 MIN
3.3750 g | Freq: Once | INTRAVENOUS | Status: AC
  Administered 2015-12-04: 3.375 g via INTRAVENOUS
  Filled 2015-12-04: qty 50

## 2015-12-04 MED ORDER — ASPIRIN EC 81 MG PO TBEC
81.0000 mg | DELAYED_RELEASE_TABLET | Freq: Every day | ORAL | Status: DC
  Administered 2015-12-05: 81 mg via ORAL
  Filled 2015-12-04: qty 1

## 2015-12-04 MED ORDER — VANCOMYCIN HCL IN DEXTROSE 1-5 GM/200ML-% IV SOLN
1000.0000 mg | Freq: Once | INTRAVENOUS | Status: AC
  Administered 2015-12-04: 1000 mg via INTRAVENOUS
  Filled 2015-12-04: qty 200

## 2015-12-04 MED ORDER — ACETAMINOPHEN 325 MG PO TABS
650.0000 mg | ORAL_TABLET | Freq: Four times a day (QID) | ORAL | Status: DC | PRN
Start: 2015-12-04 — End: 2015-12-05

## 2015-12-04 MED ORDER — CLOPIDOGREL BISULFATE 75 MG PO TABS
75.0000 mg | ORAL_TABLET | Freq: Every day | ORAL | Status: DC
Start: 2015-12-05 — End: 2015-12-05
  Administered 2015-12-05: 75 mg via ORAL
  Filled 2015-12-04: qty 1

## 2015-12-04 MED ORDER — NITROGLYCERIN 0.4 MG SL SUBL
0.4000 mg | SUBLINGUAL_TABLET | SUBLINGUAL | Status: DC | PRN
Start: 2015-12-04 — End: 2015-12-05

## 2015-12-04 MED ORDER — SODIUM CHLORIDE 0.9% FLUSH
3.0000 mL | Freq: Two times a day (BID) | INTRAVENOUS | Status: DC
  Administered 2015-12-04 (×2): 3 mL via INTRAVENOUS

## 2015-12-04 MED ORDER — CARBIDOPA-LEVODOPA 10-100 MG PO TABS
1.0000 | ORAL_TABLET | Freq: Every day | ORAL | Status: DC
  Administered 2015-12-04: 1 via ORAL
  Filled 2015-12-04: qty 1

## 2015-12-04 MED ORDER — PIPERACILLIN-TAZOBACTAM 3.375 G IVPB
3.3750 g | Freq: Three times a day (TID) | INTRAVENOUS | Status: DC
  Filled 2015-12-04: qty 50

## 2015-12-04 MED ORDER — BISACODYL 10 MG RE SUPP: 10.0000 mg | Freq: Every day | RECTAL | Status: DC | PRN

## 2015-12-04 NOTE — Progress Notes (Signed)
Pts. Troponin elevated to 0.65 at 2020. Dr. Almyra Free notified and putting in cardio consult.

## 2015-12-04 NOTE — ED Notes (Signed)
Dr lord at bedside 

## 2015-12-04 NOTE — Consult Note (Signed)
[facility] Cardiology  CARDIOLOGY CONSULT NOTE  Patient ID: Jay Hughes MRN: [id number] DOB/AGE: 1916-10-29 82 y.o.  Admit date: 12/04/2015 Referring Physician [facility] Primary Physician [facility] Primary Cardiologist Nithila Sumners Reason for Consultation Borderline elevated troponin  HPI: 82 year old gentleman referred for borderline elevated troponin. The patient has a history of prior non-ST elevation myocardial infarction, essential hypertension, currently resides in assisted living facility. The patient presents to [facility] emergency room for lethargy and altered mental status. EKG was nondiagnostic. Admission labs were notable for borderline elevated troponin. Chest x-ray did not reveal any acute cardiopulmonary abnormality. Head CT was unremarkable. The patient has an active DO NOT RESUSCITATE order.  Review of systems complete and found to be negative unless listed above     Past Medical History:  Diagnosis Date  . Hypertension   . 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 No pertinent surgical history.  Prescriptions Prior to Admission  Medication Sig Dispense Refill Last Dose  . aspirin 81 MG chewable tablet Chew 81 mg by mouth every evening. *Take with food/meals*   12/03/2015 at Unknown time  . carbidopa-levodopa (SINEMET IR) 10-100 MG per tablet Take 1 tablet by mouth at bedtime.    12/03/2015 at Unknown time  . cholecalciferol (VITAMIN D) 1000 units tablet Take 1,000 Units by mouth every other day. Take in the evening.   12/02/2015  . clopidogrel (PLAVIX) 75 MG tablet Take 1 tablet (75 mg total) by mouth daily. 30 tablet 2 12/03/2015 at Unknown time  . doxazosin (CARDURA) 2 MG tablet Take 2 mg by mouth daily.   12/03/2015 at Unknown time  . Hydrocodone-Acetaminophen 7.5-300 MG TABS Take 1 tablet by mouth every 4 (four) hours as needed. For pain.   Past Month at Unknown time  . isosorbide mononitrate (IMDUR) 30 MG 24 hr tablet Take 1 tablet (30 mg total) by mouth daily. 30 tablet 2  12/03/2015 at Unknown time  . nabumetone (RELAFEN) 500 MG tablet Take 500 mg by mouth daily.   12/03/2015 at Unknown time  . nitroGLYCERIN (NITROSTAT) 0.4 MG SL tablet Place 1 tablet (0.4 mg total) under the tongue every 5 (five) minutes as needed for chest pain. (Patient taking differently: Place 0.4 mg under the tongue every 5 (five) minutes x 3 doses as needed for chest pain. *If no relief, Call 911*) 30 tablet 12 unknown  . oxybutynin (DITROPAN-XL) 5 MG 24 hr tablet Take 5 mg by mouth daily.   12/03/2015 at Unknown time  . phenytoin (DILANTIN) 100 MG ER capsule Take 100 mg by mouth daily.   12/03/2015 at Unknown time  . senna (SENOKOT) 8.6 MG TABS tablet Take 8.6 mg by mouth daily as needed for mild constipation.   unknown  . triamcinolone lotion (KENALOG) 0.1 % Apply 1 application topically 2 (two) times daily as needed. For dry skin.   unknown  . vitamin B-12 (CYANOCOBALAMIN) 1000 MCG tablet Take 500 mcg by mouth every other day. In the evening.   12/02/2015   Social History   Social History  . Marital status: Single    Spouse name: N/A  . Number of children: N/A  . Years of education: N/A   Occupational History  . Not on file.   Social History Main Topics  . Smoking status: Former Research scientist (life sciences)  . Smokeless tobacco: Never Used  . Alcohol use No  . Drug use: Unknown  . Sexual activity: Not on file   Other Topics Concern  . Not on file   Social History Narrative  .  No narrative on file    History reviewed. No pertinent family history.    Review of systems complete and found to be negative unless listed above      PHYSICAL EXAM  General: Well developed, well nourished, in no acute distress HEENT:  Normocephalic and atramatic Neck:  No JVD.  Lungs: Clear bilaterally to auscultation and percussion. Heart: HRRR . Normal S1 and S2 without gallops or murmurs.  Abdomen: Bowel sounds are positive, abdomen soft and non-tender  Msk:  Back normal, normal gait. Normal strength and tone  for age. Extremities: No clubbing, cyanosis or edema.   Neuro: Alert and oriented X 3. Psych:  Good affect, responds appropriately  Labs:   Lab Results  Component Value Date   WBC 12.1 (H) 12/04/2015   HGB 14.0 12/04/2015   HCT 41.4 12/04/2015   MCV 85.9 12/04/2015   PLT 206 12/04/2015    Recent Labs Lab 12/04/15 0837  NA 137  K 4.1  CL 105  CO2 21*  BUN 32*  CREATININE 1.09  CALCIUM 9.0  PROT 7.5  BILITOT 0.7  ALKPHOS 95  ALT 9*  AST 21  GLUCOSE 160*   Lab Results  Component Value Date   TROPONINI 0.23 (HH) 12/04/2015    Lab Results  Component Value Date   CHOL 183 03/13/2015   Lab Results  Component Value Date   HDL 46 03/13/2015   Lab Results  Component Value Date   LDLCALC 121 (H) 03/13/2015   Lab Results  Component Value Date   TRIG 79 03/13/2015   Lab Results  Component Value Date   CHOLHDL 4.0 03/13/2015   No results found for: LDLDIRECT    Radiology: Dg Chest 2 View  Result Date: 12/04/2015 CLINICAL DATA:  Vomiting and chest pain this morning.  Lethargy. EXAM: CHEST  2 VIEW COMPARISON:  Chest x-rays dated 03/13/2015 and 04/19/2014. FINDINGS: Mild cardiomegaly is stable. Overall cardiomediastinal silhouette is stable in size and configuration. Atherosclerotic changes again noted at the aortic arch. Coarse interstitial lung markings bilaterally are stable, presumed chronic interstitial lung disease. No new lung findings. No evidence of pneumonia. No pleural effusion or pneumothorax seen. Extensive degenerative change noted at each shoulder. Did additional degenerative changes within the scoliotic thoracolumbar spine. No acute -appearing osseous abnormality. IMPRESSION: 1. No acute findings. 2. Probable chronic interstitial lung disease. No new lung findings. No evidence of pneumonia. 3. Stable cardiomegaly. 4. Aortic atherosclerosis. Electronically Signed   By: Franki Cabot M.D.   On: 12/04/2015 09:10  Ct Head Wo Contrast  Result Date:  12/04/2015 CLINICAL DATA:  Altered mental status. EXAM: CT HEAD WITHOUT CONTRAST TECHNIQUE: Contiguous axial images were obtained from the base of the skull through the vertex without intravenous contrast. COMPARISON:  Head CT dated 04/19/2014. FINDINGS: Brain: Generalized age related parenchymal atrophy with commensurate dilatation of the ventricles and sulci. Mild chronic small vessel ischemic changes again noted within the deep periventricular white matter regions bilaterally. There is no mass, hemorrhage, edema or other evidence of acute parenchymal abnormality. No extra-axial hemorrhage. Vascular: No hyperdense vessel or unexpected calcification. There are chronic calcified atherosclerotic changes of the large vessels at the skull base. Skull: Negative for fracture or focal lesion. Sinuses/Orbits: No acute findings. Chronic mucous retention cyst in the right maxillary sinus. Other: None. IMPRESSION: No acute findings.  No intracranial mass, hemorrhage or edema. Electronically Signed   By: Franki Cabot M.D.   On: 12/04/2015 09:21   EKG: Sinus rhythm  ASSESSMENT AND  PLAN:   1. Borderline elevated troponin, in the setting of lethargy and altered mental status, likely due to demand supply ischemia, currently on aspirin and Plavix, as well as long-acting nitrates  Recommendations  1. Agree with overall current plan 2. Defer full dose anticoagulation, especially in light of altered mental status 3. Review 2-D echocardiogram 4. Further recommendations pending echo results and patient's initial clinical course  Signed: Thorn Demas MD,PhD, [facility] 12/04/2015, 2:18 PM

## 2015-12-04 NOTE — ED Triage Notes (Signed)
Pt c/o vomiting X 2 in last hour. EMS reports nursing home [facility]) said he had CP but pt denied this with EMS.

## 2015-12-04 NOTE — H&P (Signed)
History and Physical    Jay Hughes [id number] DOB: 02-May-1917 DOA: 12/04/2015  Referring physician: Dr. Reita Cliche PCP: [name], MD  Specialists: none  Chief Complaint: altered mental status/lethargy  HPI: Jay Hughes is a 82 y.o. male has a past medical history significant for HTN who resides at ALF who was mentally alert yesterday. This AM, the patient vomited and became lethargic. He was brought to the ER this AM due to lethargy where w/u was unremarkable except for an elevated troponin. He remains lethargic, unable to provide history. BP is mildly elevated. He is now admitted.  Review of Systems: unable to obtain due to mental status/lethargy  Past Medical History:  Diagnosis Date  . Hypertension   . 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 No pertinent surgical history. Social History:  reports that he has quit smoking. He has never used smokeless tobacco. He reports that he does not drink alcohol. His drug history is not on file.  No Known Allergies  History reviewed. No pertinent family history.  Prior to Admission medications   Medication Sig Start Date End Date Taking? Authorizing Provider  aspirin EC 81 MG tablet Take 81 mg by mouth daily.    Historical Provider, MD  carbidopa-levodopa (SINEMET IR) 10-100 MG per tablet Take 1 tablet by mouth daily. 04/30/14   Historical Provider, MD  Cholecalciferol (VITAMIN D3) 1000 units CAPS Take 2,000 Units by mouth daily.    Historical Provider, MD  clopidogrel (PLAVIX) 75 MG tablet Take 1 tablet (75 mg total) by mouth daily. 03/16/15   Max Sane, MD  doxazosin (CARDURA) 2 MG tablet Take 2 mg by mouth daily. 05/17/14   Historical Provider, MD  isosorbide mononitrate (IMDUR) 30 MG 24 hr tablet Take 1 tablet (30 mg total) by mouth daily. 03/16/15   Max Sane, MD  nabumetone (RELAFEN) 500 MG tablet Take 500 mg by mouth daily. 08/31/14   Historical Provider, MD  nitroGLYCERIN (NITROSTAT) 0.4 MG SL tablet Place 1 tablet  (0.4 mg total) under the tongue every 5 (five) minutes as needed for chest pain. Patient not taking: Reported on 10/12/2015 03/16/15   Max Sane, MD  oxybutynin (DITROPAN-XL) 5 MG 24 hr tablet Take 5 mg by mouth daily. 05/28/14   Historical Provider, MD  phenytoin (DILANTIN) 100 MG ER capsule Take 100 mg by mouth daily.    Historical Provider, MD  triamcinolone ointment (KENALOG) 0.1 % Apply 1 application topically 2 (two) times daily. 04/15/14   Historical Provider, MD   Physical Exam: Vitals:   12/04/15 0821 12/04/15 0926 12/04/15 1000  BP: (!) 215/113 (!) 181/83 (!) 171/85  Pulse: (!) 104 90 83  Resp: 18 14 16   Temp: 97.6 F (36.4 C)    TempSrc: Oral    SpO2: 95% 96% 93%  Weight: 81.6 kg (180 lb)    Height: 5\' 11"  (1.803 m)       General:  Chronically ill appearing but inNo apparent distress. Lethargic. [place]/AT, WDWN  Eyes: PERRL, EOMI, no scleral icterus, conjunctiva clear  ENT: moist oropharynx without exudate. TM's benign, dentition fair  Neck: supple, no lymphadenopathy. No bruits or thyromegaly  Cardiovascular: regular rate with 2/6 systolic murmur noted. No rubs or gallops; 2+ peripheral pulses, no JVD, 1+ peripheral edema  Respiratory: CTA biL, good air movement without wheezing or rales. Basilar rhonchi noted  Abdomen: soft, non tender to palpation, positive bowel sounds, no guarding, no rebound  Skin: no rashes or lesions  Musculoskeletal: normal bulk and tone, no joint  swelling  Psychiatric: unable to assess due to lethargy  Neurologic: CN 2-12 grossly intact, Motor strength 5/5 in all 4 groups with symmetric DTR's and non-focal sensory exam  Labs on Admission:  Basic Metabolic Panel:  Recent Labs Lab 12/04/15 0837  NA 137  K 4.1  CL 105  CO2 21*  GLUCOSE 160*  BUN 32*  CREATININE 1.09  CALCIUM 9.0   Liver Function Tests:  Recent Labs Lab 12/04/15 0837  AST 21  ALT 9*  ALKPHOS 95  BILITOT 0.7  PROT 7.5  ALBUMIN 3.7    Recent Labs Lab  12/04/15 0837  LIPASE 13   No results for input(s): AMMONIA in the last 168 hours. CBC:  Recent Labs Lab 12/04/15 0837  WBC 12.1*  HGB 14.0  HCT 41.4  MCV 85.9  PLT 206   Cardiac Enzymes:  Recent Labs Lab 12/04/15 0837  TROPONINI 0.23*    BNP (last 3 results) No results for input(s): BNP in the last 8760 hours.  ProBNP (last 3 results) No results for input(s): PROBNP in the last 8760 hours.  CBG: No results for input(s): GLUCAP in the last 168 hours.  Radiological Exams on Admission: Dg Chest 2 View  Result Date: 12/04/2015 CLINICAL DATA:  Vomiting and chest pain this morning.  Lethargy. EXAM: CHEST  2 VIEW COMPARISON:  Chest x-rays dated 03/13/2015 and 04/19/2014. FINDINGS: Mild cardiomegaly is stable. Overall cardiomediastinal silhouette is stable in size and configuration. Atherosclerotic changes again noted at the aortic arch. Coarse interstitial lung markings bilaterally are stable, presumed chronic interstitial lung disease. No new lung findings. No evidence of pneumonia. No pleural effusion or pneumothorax seen. Extensive degenerative change noted at each shoulder. Did additional degenerative changes within the scoliotic thoracolumbar spine. No acute -appearing osseous abnormality. IMPRESSION: 1. No acute findings. 2. Probable chronic interstitial lung disease. No new lung findings. No evidence of pneumonia. 3. Stable cardiomegaly. 4. Aortic atherosclerosis. Electronically Signed   By: Franki Cabot M.D.   On: 12/04/2015 09:10  Ct Head Wo Contrast  Result Date: 12/04/2015 CLINICAL DATA:  Altered mental status. EXAM: CT HEAD WITHOUT CONTRAST TECHNIQUE: Contiguous axial images were obtained from the base of the skull through the vertex without intravenous contrast. COMPARISON:  Head CT dated 04/19/2014. FINDINGS: Brain: Generalized age related parenchymal atrophy with commensurate dilatation of the ventricles and sulci. Mild chronic small vessel ischemic changes again  noted within the deep periventricular white matter regions bilaterally. There is no mass, hemorrhage, edema or other evidence of acute parenchymal abnormality. No extra-axial hemorrhage. Vascular: No hyperdense vessel or unexpected calcification. There are chronic calcified atherosclerotic changes of the large vessels at the skull base. Skull: Negative for fracture or focal lesion. Sinuses/Orbits: No acute findings. Chronic mucous retention cyst in the right maxillary sinus. Other: None. IMPRESSION: No acute findings.  No intracranial mass, hemorrhage or edema. Electronically Signed   By: Franki Cabot M.D.   On: 12/04/2015 09:21   EKG: Independently reviewed.  Assessment/Plan Principal Problem:   Lethargy Active Problems:   Nausea & vomiting   Elevated troponin   HTN, goal below 140/80   Will admit to floor with telemetry as a DNR per family. Cultures sent. Begin empiric IV fluids and IV ABX. Follow BP and oxygenation closely. Follow enzymes. Order echo and Cardiology consult. Repeat labs and CXR in AM. Prognosis poor  Diet: NPO until alert Fluids: NS@75  DVT Prophylaxis: SQ Heparin  Code Status: DNR  Family Communication: yes  Disposition Plan: SNF  Time  spent: 50 min

## 2015-12-04 NOTE — ED Provider Notes (Signed)
[facility] Emergency Department Provider Note   ____________________________________________  Time seen:  I have reviewed the triage vital signs and the triage nursing note.  HISTORY  Chief Complaint Emesis   Historian Limited from patient - altered mental status History per report from nursing home and son at bedside  HPI Jay Hughes is a 82 y.o. male who stays at assisted living, but is typically mentally alert and capable of taking care of his own finances, whom reportedly staff went to bring the patient to breakfast and he vomited once and was weak all over and had altered mental status.He was brought in by EMS.  Son saw him yesterday and he was alert and normal.  Son states that the patient does not really have seizures, and he only takes one phenytoin Daily Because He Thinks It Helps Him, Although the Son Knows That This Is a Subtherapeutic Dose.  The son states that a year and a half ago he had a minor heart attack.  Son reports that the patient has had workup for nasopharyngeal mass that at one point time was felt not to be lymphoma or cancer after evaluation with Dr. Oliva Bustard, although he is now seeing a head and neck specialist for further evaluation.    Past Medical History:  Diagnosis Date  . Hypertension   . 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 HTN (hypertension) 03/13/2015  . Chest pain 03/13/2015  . NSTEMI (non-ST elevated myocardial infarction) ([place]) 03/13/2015  . 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 Arthritis 10/12/2014  . BP (high blood pressure) 10/12/2014  . HLD (hyperlipidemia) 10/12/2014  . Leg weakness 10/12/2014    History reviewed. No pertinent surgical history.  Current Outpatient Rx  . Order #: [id number] Class: Historical Med  . Order #: [id number] Class: Historical Med  . Order #: [id number] Class: Historical Med  . Order #: [id number] Class: Normal  . Order #:  [id number] Class: Historical Med  . Order #: [id number] Class: Normal  . Order #: [id number] Class: Historical Med  . Order #: [id number] Class: Normal  . Order #: [id number] Class: Historical Med  . Order #: [id number] Class: Historical Med  . Order #: [id number] Class: Historical Med  Aspirin, carbidopa, vitamin D3, Plavix, Cardura, Imdur, Relafen, Nitrostat, oxybutynin, phenytoin  Allergies Review of patient's allergies indicates no known allergies.  History reviewed. No pertinent family history.  Social History Social History  Substance Use Topics  . Smoking status: Former Research scientist (life sciences)  . Smokeless tobacco: Not on file  . Alcohol use No    Review of Systems Limited due to patient's altered mental status,  No reported chest pain or abdominal pain. No reported diarrhea. No reported recent illnesses. No reported fevers.   ____________________________________________   PHYSICAL EXAM:  VITAL SIGNS: ED Triage Vitals [12/04/15 0821]  Enc Vitals Group     BP (!) 215/113     Pulse Rate (!) 104     Resp 18     Temp 97.6 F (36.4 C)     Temp Source Oral     SpO2 95 %     Weight 180 lb (81.6 kg)     Height 5\' 11"  (1.803 m)     Head Circumference      Peak Flow      Pain Score      Pain Loc      Pain Edu?      Excl. in [place]?      Constitutional: Sleepy but alert to voice. No  acute distress. HEENT   Head: Normocephalic and atraumatic.      Eyes: Conjunctivae are normal. PERRL. Normal extraocular movements. Left eyelid somewhat droopy.      Ears:         Nose: No congestion/rhinnorhea.   Mouth/Throat: Mucous membranes are dry.   Neck: No stridor. Cardiovascular/Chest: Normal rate, irregularly irregular rhythm.  No murmurs, rubs, or gallops. Respiratory: Normal respiratory effort without tachypnea nor retractions. Breath sounds are clear and equal bilaterally. No wheezes/rales/rhonchi. Gastrointestinal: Soft. No distention, no guarding, no rebound. Nontender.    Genitourinary/rectal:Deferred Musculoskeletal: Nontender with normal range of motion in all extremities. No joint effusions.  No lower extremity tenderness.  No edema. Neurologic:  Normal speech and language. No gross or focal neurologic deficits are appreciated. Skin:  Skin is warm, dry and intact. No rash noted. Psychiatric: No hallucinations..  ____________________________________________   EKG I, Lisa Roca, MD, the attending physician have personally viewed and interpreted all ECGs.  90 bpm. Atrial fibrillation. Narrow QRS. Normal axis. Nonspecific ST and T-wave ____________________________________________  LABS (pertinent positives/negatives)  Labs Reviewed  BASIC METABOLIC PANEL - Abnormal; Notable for the following:       Result Value   CO2 21 (*)    Glucose, Bld 160 (*)    BUN 32 (*)    GFR calc non Af Amer 55 (*)    All other components within normal limits  CBC - Abnormal; Notable for the following:    WBC 12.1 (*)    RDW 14.8 (*)    All other components within normal limits  TROPONIN I - Abnormal; Notable for the following:    Troponin I 0.23 (*)    All other components within normal limits  URINALYSIS COMPLETEWITH MICROSCOPIC ([facility] ONLY) - Abnormal; Notable for the following:    Color, Urine YELLOW (*)    APPearance CLEAR (*)    Ketones, ur TRACE (*)    Bacteria, UA RARE (*)    All other components within normal limits  HEPATIC FUNCTION PANEL - Abnormal; Notable for the following:    ALT 9 (*)    Bilirubin, Direct <0.1 (*)    All other components within normal limits  PHENYTOIN LEVEL, TOTAL - Abnormal; Notable for the following:    Phenytoin Lvl <2.5 (*)    All other components within normal limits  URINE CULTURE  CULTURE, BLOOD (ROUTINE X 2)  CULTURE, BLOOD (ROUTINE X 2)  LIPASE, BLOOD  LACTIC ACID, PLASMA  LACTIC ACID, PLASMA    ____________________________________________  RADIOLOGY All Xrays were viewed by me. Imaging interpreted by  Radiologist.  Chest x-ray:IMPRESSION: 1. No acute findings. 2. Probable chronic interstitial lung disease. No new lung findings. No evidence of pneumonia. 3. Stable cardiomegaly. 4. Aortic atherosclerosis. Electronically Signed   By: Franki Cabot M.D.  CT without contrast:  IMPRESSION: No acute findings.  No intracranial mass, hemorrhage or edema. Electronically Signed   By: Franki Cabot M.D. __________________________________________  PROCEDURES  Procedure(s) performed: None  Critical Care performed: None  ____________________________________________   ED COURSE / ASSESSMENT AND PLAN  Pertinent labs & imaging results that were available during my care of the patient were reviewed by me and considered in my medical decision making (see chart for details).   This patient came in with what is altered mental status, uncertain etiology. Patient came in after an episode of vomiting without complaint of chest pain or trouble breathing. No abdominal pain or distention. No other diarrhea symptoms.  Initially somewhat hypertensive  when he came in, I was concerned about the possibility for head bleed as he is on aspirin and Plavix.  CT head was obtained and negative for any acute findings.  On his laboratory studies his white blood cell count is a little elevated, otherwise no major normal findings.  Although he has not reported having a fever, I'm considering bacteremia or sepsis as a possibility for why he is having altered mental status and I will place him on vancomycin and Zosyn until bacterial cultures return.  The patient did have a little bit of drooping of the left eyelid but the son says this is old. The patient is able to both his arms out in front of me does not appear to have any focal weakness. Therefore I think acute stroke to be unlikely  In any case, he is having altered mental status and not at his baseline and will need hospital workup and  management.    CONSULTATIONS:   Hospitalist for admission   Patient / Family / Caregiver informed of clinical course, medical decision-making process, and agree with plan.     ___________________________________________   FINAL CLINICAL IMPRESSION(S) / ED DIAGNOSES   Final diagnoses:  Altered mental status, unspecified altered mental status type              Note: This dictation was prepared with Dragon dictation. Any transcriptional errors that result from this process are unintentional    Lisa Roca, MD 12/04/15 1039

## 2015-12-04 NOTE — ED Notes (Signed)
This RN and Ronalee Belts, RN introduced selves to patient and patient's son, Roselie Awkward. Per Roselie Awkward, pt condition is not patient baseline. Pt's son states that patient is usually alert and oriented, pays his own bills, and performs ADL's with some assistance from [facility] staff. Pt presents somewhat alert to verbal, pt also noted to have dry mucous membranes in his mouth. Pt resting in bed with eyes closed at this time.

## 2015-12-04 NOTE — ED Notes (Signed)
Pt currently in xray. Will also obtain CT while there.

## 2015-12-04 NOTE — ED Notes (Signed)
Dr Reita Cliche notified troponin 0.23

## 2015-12-04 NOTE — Plan of Care (Signed)
Problem: Education: Goal: Knowledge of [facility] Education information/materials will improve Outcome: Not Progressing Pt is confused. Plan explained to son

## 2015-12-04 NOTE — Progress Notes (Signed)
Pharmacy Antibiotic Note  Parv Rashid is a 82 y.o. male admitted on 12/04/2015 with possible aspiration pneumonia.  Pharmacy has been consulted for piperacillin/tazobactam dosing.  Plan:  Patient received one dose of piperacillin/tazobactam in ED (3.375g IV over 30 minutes). Patient is to begin piperacillin/tazobactam 3.375 IV q8h (4hr infusion) approx 6 hours after initial ED dose.  Height: 5\' 11"  (180.3 cm) Weight: 180 lb (81.6 kg) IBW/kg (Calculated) : 75.3  Temp (24hrs), Avg:97.6 F (36.4 C), Min:97.6 F (36.4 C), Max:97.6 F (36.4 C)   Recent Labs Lab 12/04/15 0837 12/04/15 0914  WBC 12.1*  --   CREATININE 1.09  --   LATICACIDVEN  --  1.6    Estimated Creatinine Clearance: 41.3 mL/min (by C-G formula based on SCr of 1.09 mg/dL).    No Known Allergies  Antimicrobials this admission: Piperacillin/tazobactam 07/23 >> Vancomycin 07/23 >>   Microbiology results: 07/23 BCx: Sent 07/23 UCx: Sent    Thank you for allowing pharmacy to be a part of this patient's care.  Darrow Bussing, PharmD 12/04/2015 11:16 AM

## 2015-12-04 NOTE — Care Management Obs Status (Signed)
[place] NOTIFICATION   Patient Details  Name: Jay Hughes MRN: [id number] Date of Birth: 12/11/1916   Medicare Observation Status Notification Given:  Yes (altered mental status /copy left at bedside)    Ival Bible, RN 12/04/2015, 11:32 AM

## 2015-12-04 NOTE — Progress Notes (Signed)
Pt received to floor, room 150. No family currently at bedside, pt sleeping.

## 2015-12-04 NOTE — ED Notes (Signed)
Report called to Leslie, RN

## 2015-12-04 NOTE — ED Notes (Signed)
Per son pt was acting different and vomiting when nursing staff went in room at 0700.  Was at baseline last night.

## 2015-12-04 NOTE — ED Notes (Signed)
Little to no response with IV and in and out cath

## 2015-12-05 ENCOUNTER — Observation Stay: Payer: Medicare Other

## 2015-12-05 DIAGNOSIS — R4182 Altered mental status, unspecified: Secondary | ICD-10-CM | POA: Diagnosis not present

## 2015-12-05 LAB — CBC
HCT: 38.6 % — ABNORMAL LOW (ref 40.0–52.0)
Hemoglobin: 13.2 g/dL (ref 13.0–18.0)
MCH: 29 pg (ref 26.0–34.0)
MCHC: 34.3 g/dL (ref 32.0–36.0)
MCV: 84.8 fL (ref 80.0–100.0)
PLATELETS: 206 10*3/uL (ref 150–440)
RBC: 4.55 MIL/uL (ref 4.40–5.90)
RDW: 14.9 % — AB (ref 11.5–14.5)
WBC: 14.3 10*3/uL — AB (ref 3.8–10.6)

## 2015-12-05 LAB — COMPREHENSIVE METABOLIC PANEL
ALT: 9 U/L — AB (ref 17–63)
AST: 23 U/L (ref 15–41)
Albumin: 3.3 g/dL — ABNORMAL LOW (ref 3.5–5.0)
Alkaline Phosphatase: 80 U/L (ref 38–126)
Anion gap: 6 (ref 5–15)
BILIRUBIN TOTAL: 1 mg/dL (ref 0.3–1.2)
BUN: 29 mg/dL — AB (ref 6–20)
CO2: 26 mmol/L (ref 22–32)
CREATININE: 0.95 mg/dL (ref 0.61–1.24)
Calcium: 8.8 mg/dL — ABNORMAL LOW (ref 8.9–10.3)
Chloride: 105 mmol/L (ref 101–111)
GFR calc Af Amer: >60 mL/min (ref >60)
Glucose, Bld: 124 mg/dL — ABNORMAL HIGH (ref 65–99)
POTASSIUM: 4.3 mmol/L (ref 3.5–5.1)
Sodium: 137 mmol/L (ref 135–145)
TOTAL PROTEIN: 6.7 g/dL (ref 6.5–8.1)

## 2015-12-05 LAB — URINE CULTURE: CULTURE: NO GROWTH

## 2015-12-05 LAB — ECHOCARDIOGRAM COMPLETE
Height: 71 in
WEIGHTICAEL: 2880 [oz_av]

## 2015-12-05 LAB — GLUCOSE, CAPILLARY: Glucose-Capillary: 120 mg/dL — ABNORMAL HIGH (ref 65–99)

## 2015-12-05 LAB — TROPONIN I: Troponin I: 0.73 ng/mL (ref <0.03)

## 2015-12-05 NOTE — NC FL2 (Signed)
[place] LEVEL OF CARE SCREENING TOOL     IDENTIFICATION  Patient Name: Jay Hughes Birthdate: 02/02/1917 Sex: male Admission Date (Current Location): 12/04/2015  [place] and [place] Number:  Engineering geologist and Address:  [facility], [address] [place], [place] [address]      Provider Number: [id number]  Attending Physician Name and Address:  Fritzi Mandes, MD  Relative Name and Phone Number:       Current Level of Care: Hospital Recommended Level of Care: [place] Prior Approval Number:    Date Approved/Denied:   PASRR Number:  ([id number])  Discharge Plan: Domiciliary (Rest home)    Current Diagnoses: Patient Active Problem List   Diagnosis Date Noted  . Lethargy 12/04/2015  . Nausea & vomiting 12/04/2015  . Elevated troponin 12/04/2015  . HTN, goal below 140/80 12/04/2015  . Pressure ulcer 12/04/2015  . HTN (hypertension) 03/13/2015  . Chest pain 03/13/2015  . NSTEMI (non-ST elevated myocardial infarction) ([place]) 03/13/2015  . 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 Arthritis 10/12/2014  . BP (high blood pressure) 10/12/2014  . HLD (hyperlipidemia) 10/12/2014  . Leg weakness 10/12/2014    Orientation RESPIRATION BLADDER Height & Weight     Self  Normal Incontinent Weight: 180 lb (81.6 kg) Height:  5\' 11"  (180.3 cm)  BEHAVIORAL SYMPTOMS/MOOD NEUROLOGICAL BOWEL NUTRITION STATUSDysphagia 2 w/ thin liquids; strict aspiration precautions. NO Straws if coughing noted. Meds CRUSHED in Puree. Assistance at all meals; must be positioned fully upright; reduce distractions during meals. Rest breaks during meals as needed.Add condiments to flavor and moisten. Nutritional supplements as ordered - drink supplements might be easier.     (None)  (None) Incontinent   AMBULATORY STATUS COMMUNICATION OF NEEDS Skin   Limited Assist Verbally Other (Comment) (Pressure Ulcer Stage II Lower Sacrum )                        Personal Care Assistance Level of Assistance  Bathing, Feeding, Dressing Bathing Assistance: Limited assistance Feeding assistance: Limited assistance Dressing Assistance: Limited assistance     Functional Limitations Info  Sight, Hearing, Speech Sight Info: Impaired Hearing Info: Adequate Speech Info: Adequate    SPECIAL CARE FACTORS FREQUENCY   No PT follow up                     Contractures      Additional Factors Info  Code Status, Allergies Code Status Info:  (DNR) Allergies Info:  (No Known Allergies )          Discharge Medications: Please see discharge summary for a list of discharge medications. DISCHARGE MEDICATIONS:       Current Discharge Medication List        CONTINUE these medications which have NOT CHANGED   Details  aspirin 81 MG chewable tablet Chew 81 mg by mouth every evening. *Take with food/meals*    carbidopa-levodopa (SINEMET IR) 10-100 MG per tablet Take 1 tablet by mouth at bedtime.    Associated Diagnoses: Atypical lymphocytosis    cholecalciferol (VITAMIN D) 1000 units tablet Take 1,000 Units by mouth every other day. Take in the evening.    clopidogrel (PLAVIX) 75 MG tablet Take 1 tablet (75 mg total) by mouth daily. Qty: 30 tablet, Refills: 2    doxazosin (CARDURA) 2 MG tablet Take 2 mg by mouth daily.   Associated Diagnoses: Atypical lymphocytosis    Hydrocodone-Acetaminophen 7.5-300 MG TABS Take 1  tablet by mouth every 4 (four) hours as needed. For pain.    isosorbide mononitrate (IMDUR) 30 MG 24 hr tablet Take 1 tablet (30 mg total) by mouth daily. Qty: 30 tablet, Refills: 2    nabumetone (RELAFEN) 500 MG tablet Take 500 mg by mouth daily.   Associated Diagnoses: Atypical lymphocytosis    nitroGLYCERIN (NITROSTAT) 0.4 MG SL tablet Place 1 tablet (0.4 mg total) under the tongue every 5 (five) minutes as needed for chest pain. Qty: 30 tablet, Refills: 12    oxybutynin (DITROPAN-XL) 5 MG 24 hr  tablet Take 5 mg by mouth daily.   Associated Diagnoses: Atypical lymphocytosis    phenytoin (DILANTIN) 100 MG ER capsule Take 100 mg by mouth daily.   Associated Diagnoses: Atypical lymphocytosis    senna (SENOKOT) 8.6 MG TABS tablet Take 8.6 mg by mouth daily as needed for mild constipation.    triamcinolone lotion (KENALOG) 0.1 % Apply 1 application topically 2 (two) times daily as needed. For dry skin.    vitamin B-12 (CYANOCOBALAMIN) 1000 MCG tablet Take 500 mcg by mouth every other day. In the evening.         Relevant Imaging Results:  Relevant Lab Results:   Additional Information  (SSN [id number])  Jahmeer Porche, Veronia Beets, LCSW

## 2015-12-05 NOTE — Discharge Summary (Signed)
[facility] [place] NAME: Jay Hughes    MR#:  [id number]  DATE OF BIRTH:  Aug 25, 1916  DATE OF ADMISSION:  12/04/2015 ADMITTING PHYSICIAN: Idelle Crouch, MD  DATE OF DISCHARGE: 12/05/15  PRIMARY CARE PHYSICIAN: [name], MD    ADMISSION DIAGNOSIS:  Altered mental status, unspecified altered mental status type [R41.82]  DISCHARGE DIAGNOSIS:  AMS Clinical dehydration Acute viral syndrome Impaired hearing  SECONDARY DIAGNOSIS:   Past Medical History:  Diagnosis Date  . Hypertension   . 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 This AM, the patient vomited and became lethargic. He was brought to the ER this AM due to lethargy where w/u was unremarkable except for an elevated troponin  1. AMS/Lethargy appears due to clincal dehydration from vomiting -recieved IVF -creat stable -start po diet. No more vomiting -no source of infection identified CT head neg for CVA -BC and UC per lab neg so far  2.Nausea &vomiting -appears viral -no more emesis since admission  3.Elevated troponin -seen by cardiology -appears demand ischemia -cont cardiac meds per dr Josefa Half  4.HTN, goal below 140/80  Overall seems to be improving d/w son ok to go back to ALF. Pt uses motorized scooter so will cancel PT  CONSULTS OBTAINED:  Treatment Team:  Isaias Cowman, MD  DRUG ALLERGIES:  No Known Allergies  DISCHARGE MEDICATIONS:   Current Discharge Medication List    CONTINUE these medications which have NOT CHANGED   Details  aspirin 81 MG chewable tablet Chew 81 mg by mouth every evening. *Take with food/meals*    carbidopa-levodopa (SINEMET IR) 10-100 MG per tablet Take 1 tablet by mouth at bedtime.    Associated Diagnoses: Atypical lymphocytosis     cholecalciferol (VITAMIN D) 1000 units tablet Take 1,000 Units by mouth every other day. Take in the evening.    clopidogrel (PLAVIX) 75 MG tablet Take 1 tablet (75 mg total) by mouth daily. Qty: 30 tablet, Refills: 2    doxazosin (CARDURA) 2 MG tablet Take 2 mg by mouth daily.   Associated Diagnoses: Atypical lymphocytosis    Hydrocodone-Acetaminophen 7.5-300 MG TABS Take 1 tablet by mouth every 4 (four) hours as needed. For pain.    isosorbide mononitrate (IMDUR) 30 MG 24 hr tablet Take 1 tablet (30 mg total) by mouth daily. Qty: 30 tablet, Refills: 2    nabumetone (RELAFEN) 500 MG tablet Take 500 mg by mouth daily.   Associated Diagnoses: Atypical lymphocytosis    nitroGLYCERIN (NITROSTAT) 0.4 MG SL tablet Place 1 tablet (0.4 mg total) under the tongue every 5 (five) minutes as needed for chest pain. Qty: 30 tablet, Refills: 12    oxybutynin (DITROPAN-XL) 5 MG 24 hr tablet Take 5 mg by mouth daily.   Associated Diagnoses: Atypical lymphocytosis    phenytoin (DILANTIN) 100 MG ER capsule Take 100 mg by mouth daily.   Associated Diagnoses: Atypical lymphocytosis    senna (SENOKOT) 8.6 MG TABS tablet Take 8.6 mg by mouth daily as needed for mild constipation.    triamcinolone lotion (KENALOG) 0.1 % Apply 1 application topically 2 (two) times daily as needed. For dry skin.    vitamin B-12 (CYANOCOBALAMIN) 1000 MCG tablet Take 500 mcg by mouth every other day. In the evening.        If you experience worsening of your  admission symptoms, develop shortness of breath, life threatening emergency, suicidal or homicidal thoughts you must seek medical attention immediately by calling 911 or calling your MD immediately  if symptoms less severe.  You Must read complete instructions/literature along with all the possible adverse reactions/side effects for all the Medicines you take and that have been prescribed to you. Take any new Medicines after you have completely understood and accept  all the possible adverse reactions/side effects.   Please note  You were cared for by a hospitalist during your hospital stay. If you have any questions about your discharge medications or the care you received while you were in the hospital after you are discharged, you can call the unit and asked to speak with the hospitalist on call if the hospitalist that took care of you is not available. Once you are discharged, your primary care physician will handle any further medical issues. Please note that NO REFILLS for any discharge medications will be authorized once you are discharged, as it is imperative that you return to your primary care physician (or establish a relationship with a primary care physician if you do not have one) for your aftercare needs so that they can reassess your need for medications and monitor your lab values.  DATA REVIEW:   CBC   Recent Labs Lab 12/05/15 0143  WBC 14.3*  HGB 13.2  HCT 38.6*  PLT 206    Chemistries   Recent Labs Lab 12/05/15 0143  NA 137  K 4.3  CL 105  CO2 26  GLUCOSE 124*  BUN 29*  CREATININE 0.95  CALCIUM 8.8*  AST 23  ALT 9*  ALKPHOS 80  BILITOT 1.0    Microbiology Results   No results found for this or any previous visit (from the past 240 hour(s)).  RADIOLOGY:  Dg Chest 1 View  Result Date: 12/05/2015 CLINICAL DATA:  Cough and hypertension EXAM: CHEST 1 VIEW COMPARISON:  December 04, 2015 FINDINGS: Chronic interstitial fibrotic change remains without change. There is no frank edema or consolidation. There is stable cardiac prominence with pulmonary vascular within normal limits. No adenopathy. Aorta is tortuous with extensive calcification in the aorta. Prominence the right peritracheal region is felt to be consistent with great vessel prominence in this age group. There is advanced arthropathy in both shoulders with remodeling of both humeral heads. IMPRESSION: Underlying interstitial fibrosis. No frank edema or  consolidation. Stable cardiac silhouette. There is aortic atherosclerosis. Apparent great vessel prominence is noted in the right peritracheal region. 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 Advanced arthropathy noted in both shoulders. Electronically Signed   By: Lowella Grip III M.D.   On: 12/05/2015 07:27  Dg Chest 2 View  Result Date: 12/04/2015 CLINICAL DATA:  Vomiting and chest pain this morning.  Lethargy. EXAM: CHEST  2 VIEW COMPARISON:  Chest x-rays dated 03/13/2015 and 04/19/2014. FINDINGS: Mild cardiomegaly is stable. Overall cardiomediastinal silhouette is stable in size and configuration. Atherosclerotic changes again noted at the aortic arch. Coarse interstitial lung markings bilaterally are stable, presumed chronic interstitial lung disease. No new lung findings. No evidence of pneumonia. No pleural effusion or pneumothorax seen. Extensive degenerative change noted at each shoulder. Did additional degenerative changes within the scoliotic thoracolumbar spine. No acute -appearing osseous abnormality. IMPRESSION: 1. No acute findings. 2. Probable chronic interstitial lung disease. No new lung findings. No evidence of pneumonia. 3. Stable cardiomegaly. 4. Aortic atherosclerosis. Electronically Signed   By: Cherlynn Kaiser  Enriqueta Shutter M.D.   On: 12/04/2015 09:10  Ct Head Wo Contrast  Result Date: 12/04/2015 CLINICAL DATA:  Altered mental status. EXAM: CT HEAD WITHOUT CONTRAST TECHNIQUE: Contiguous axial images were obtained from the base of the skull through the vertex without intravenous contrast. COMPARISON:  Head CT dated 04/19/2014. FINDINGS: Brain: Generalized age related parenchymal atrophy with commensurate dilatation of the ventricles and sulci. Mild chronic small vessel ischemic changes again noted within the deep periventricular white matter regions bilaterally. There is no mass, hemorrhage, edema or other evidence of acute parenchymal  abnormality. No extra-axial hemorrhage. Vascular: No hyperdense vessel or unexpected calcification. There are chronic calcified atherosclerotic changes of the large vessels at the skull base. Skull: Negative for fracture or focal lesion. Sinuses/Orbits: No acute findings. Chronic mucous retention cyst in the right maxillary sinus. Other: None. IMPRESSION: No acute findings.  No intracranial mass, hemorrhage or edema. Electronically Signed   By: Franki Cabot M.D.   On: 12/04/2015 09:21    Management plans discussed with the patient, family and they are in agreement.  CODE STATUS:     Code Status Orders        Start     Ordered   12/04/15 1257  Do not attempt resuscitation (DNR)  Continuous    Question Answer Comment  In the event of cardiac or respiratory ARREST Do not call a "code blue"   In the event of cardiac or respiratory ARREST Do not perform Intubation, CPR, defibrillation or ACLS   In the event of cardiac or respiratory ARREST Use medication by any route, position, wound care, and other measures to relive pain and suffering. May use oxygen, suction and manual treatment of airway obstruction as needed for comfort.      12/04/15 1257    Code Status History    Date Active Date Inactive Code Status Order ID Comments User Context   12/04/2015 12:57 PM 12/04/2015  1:21 PM DNR [id number]  Idelle Crouch, MD Inpatient   03/13/2015  5:22 AM 03/16/2015  5:03 PM DNR [id number]  Quintella Baton, MD Inpatient    Questions for Most Recent Historical Code Status (Order [id number])    Question Answer Comment   In the event of cardiac or respiratory ARREST Do not call a "code blue"    In the event of cardiac or respiratory ARREST Do not perform Intubation, CPR, defibrillation or ACLS    In the event of cardiac or respiratory ARREST Use medication by any route, position, wound care, and other measures to relive pain and suffering. May use oxygen, suction and manual treatment of airway obstruction as  needed for comfort.         Advance Directive Documentation   Flowsheet Row Most Recent Value  Type of Advance Directive  Healthcare Power of Attorney, Living will  Pre-existing out of facility DNR order (yellow form or pink MOST form)  No data  "MOST" Form in Place?  No data      TOTAL TIME TAKING CARE OF THIS PATIENT: 40 minutes.    Chelcey Caputo M.D on 12/05/2015 at 9:07 AM  Between 7am to 6pm - Pager - [phone] After 6pm go to [email or web address] - password [email or web address] [facility]  Office  [phone]  CC: Primary care physician; [name], MD

## 2015-12-05 NOTE — Progress Notes (Signed)
[facility] [place] NAME: Jay Hughes    MR#:  [id number]  DATE OF BIRTH:  December 15, 1916  SUBJECTIVE:  Very hard on hearing.  More awake and alert Pleasantly confused No more vomiting in the hospital.  REVIEW OF SYSTEMS:   Review of Systems  Constitutional: Negative for chills, fever and weight loss.  HENT: Positive for hearing loss. Negative for ear discharge and nosebleeds.   Eyes: Negative for blurred vision, pain and discharge.  Respiratory: Negative for sputum production, shortness of breath, wheezing and stridor.   Cardiovascular: Negative for chest pain, palpitations, orthopnea and PND.  Gastrointestinal: Negative for abdominal pain, diarrhea, nausea and vomiting.  Genitourinary: Negative for frequency and urgency.  Musculoskeletal: Negative for back pain and joint pain.  Neurological: Negative for sensory change, speech change, focal weakness and weakness.  Psychiatric/Behavioral: Negative for depression and hallucinations. The patient is not nervous/anxious.    Tolerating PT: pt uses Transport planner  DRUG ALLERGIES:  No Known Allergies  VITALS:  Blood pressure 137/65, pulse 83, temperature 97.8 F (36.6 C), temperature source Oral, resp. rate 20, height 5\' 11"  (1.803 m), weight 81.6 kg (180 lb), SpO2 95 %.  PHYSICAL EXAMINATION:   Physical Exam  GENERAL:  82 y.o.-year-old patient lying in the bed with no acute distress.  EYES: Pupils equal, round, reactive to light and accommodation. No scleral icterus. Extraocular muscles intact.  HEENT: Head atraumatic, normocephalic. Oropharynx and nasopharynx clear.  NECK:  Supple, no jugular venous distention. No thyroid enlargement, no tenderness.  LUNGS: Normal breath sounds bilaterally, no wheezing, rales, rhonchi. No use of accessory muscles of respiration.  CARDIOVASCULAR: S1, S2 normal. No murmurs, rubs, or gallops.  ABDOMEN: Soft, nontender, nondistended. Bowel sounds  present. No organomegaly or mass.  EXTREMITIES: No cyanosis, clubbing or edema b/l.    NEUROLOGIC: Cranial nerves II through XII are intact. No focal Motor or sensory deficits b/l.   PSYCHIATRIC:  patient is alert. Some pleasant confusion SKIN: No obvious rash, lesion, or ulcer.   LABORATORY PANEL:  CBC  Recent Labs Lab 12/05/15 0143  WBC 14.3*  HGB 13.2  HCT 38.6*  PLT 206    Chemistries   Recent Labs Lab 12/05/15 0143  NA 137  K 4.3  CL 105  CO2 26  GLUCOSE 124*  BUN 29*  CREATININE 0.95  CALCIUM 8.8*  AST 23  ALT 9*  ALKPHOS 80  BILITOT 1.0   Cardiac Enzymes  Recent Labs Lab 12/05/15 0143  TROPONINI 0.73*   RADIOLOGY:  Dg Chest 1 View  Result Date: 12/05/2015 CLINICAL DATA:  Cough and hypertension EXAM: CHEST 1 VIEW COMPARISON:  December 04, 2015 FINDINGS: Chronic interstitial fibrotic change remains without change. There is no frank edema or consolidation. There is stable cardiac prominence with pulmonary vascular within normal limits. No adenopathy. Aorta is tortuous with extensive calcification in the aorta. Prominence the right peritracheal region is felt to be consistent with great vessel prominence in this age group. There is advanced arthropathy in both shoulders with remodeling of both humeral heads. IMPRESSION: Underlying interstitial fibrosis. No frank edema or consolidation. Stable cardiac silhouette. There is aortic atherosclerosis. Apparent great vessel prominence is noted in the right peritracheal region. 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 Advanced arthropathy noted in both shoulders. Electronically Signed   By: Lowella Grip III M.D.   On: 12/05/2015 07:27  Dg Chest 2 View  Result Date: 12/04/2015  CLINICAL DATA:  Vomiting and chest pain this morning.  Lethargy. EXAM: CHEST  2 VIEW COMPARISON:  Chest x-rays dated 03/13/2015 and 04/19/2014. FINDINGS: Mild cardiomegaly is stable. Overall  cardiomediastinal silhouette is stable in size and configuration. Atherosclerotic changes again noted at the aortic arch. Coarse interstitial lung markings bilaterally are stable, presumed chronic interstitial lung disease. No new lung findings. No evidence of pneumonia. No pleural effusion or pneumothorax seen. Extensive degenerative change noted at each shoulder. Did additional degenerative changes within the scoliotic thoracolumbar spine. No acute -appearing osseous abnormality. IMPRESSION: 1. No acute findings. 2. Probable chronic interstitial lung disease. No new lung findings. No evidence of pneumonia. 3. Stable cardiomegaly. 4. Aortic atherosclerosis. Electronically Signed   By: Franki Cabot M.D.   On: 12/04/2015 09:10  Ct Head Wo Contrast  Result Date: 12/04/2015 CLINICAL DATA:  Altered mental status. EXAM: CT HEAD WITHOUT CONTRAST TECHNIQUE: Contiguous axial images were obtained from the base of the skull through the vertex without intravenous contrast. COMPARISON:  Head CT dated 04/19/2014. FINDINGS: Brain: Generalized age related parenchymal atrophy with commensurate dilatation of the ventricles and sulci. Mild chronic small vessel ischemic changes again noted within the deep periventricular white matter regions bilaterally. There is no mass, hemorrhage, edema or other evidence of acute parenchymal abnormality. No extra-axial hemorrhage. Vascular: No hyperdense vessel or unexpected calcification. There are chronic calcified atherosclerotic changes of the large vessels at the skull base. Skull: Negative for fracture or focal lesion. Sinuses/Orbits: No acute findings. Chronic mucous retention cyst in the right maxillary sinus. Other: None. IMPRESSION: No acute findings.  No intracranial mass, hemorrhage or edema. Electronically Signed   By: Franki Cabot M.D.   On: 12/04/2015 09:21  ASSESSMENT AND PLAN:   Jay Hughes is a 82 y.o. male has a past medical history significant for HTN who resides at  ALF who was mentally alert yesterday. This AM, the patient vomited and became lethargic. He was brought to the ER this AM due to lethargy where w/u was unremarkable except for an elevated troponin  1. AMS/Lethargy appears due to subjective dehydration from vomiting -recieved IVF -creat stable -start po diet. No more vomiting -no source of infection identified CT head neg for CVA -BC and UC per lab neg so far  2.  Nausea & vomiting -appears viral -no more emesis since admission   3.Elevated troponin -seen by cardiology -appears demand ischemia -cont cardiac meds per dr Josefa Half  4. HTN, goal below 140/80  Overall seems to be improving d/w son ok to go back to ALF. Pt uses motorized scooter so will cancel PT  Case discussed with Care Management/Social Worker. Management plans discussed with the patient, family and they are in agreement.  CODE STATUS: DNR  DVT Prophylaxis: lovenox  TOTAL TIME TAKING CARE OF THIS PATIENT:49 minutes.  >50% time spent on counselling and coordination of care  POSSIBLE D/C IN 1 DAYS, DEPENDING ON CLINICAL CONDITION.  Note: This dictation was prepared with Dragon dictation along with smaller phrase technology. Any transcriptional errors that result from this process are unintentional.  Eve Rey M.D on 12/05/2015 at 8:49 AM  Between 7am to 6pm - Pager - [phone]  After 6pm go to [email or web address] - password [email or web address] [facility]  Office  [phone]  CC: Primary care physician; [name], MD

## 2015-12-05 NOTE — Progress Notes (Signed)
Patients son notified via telephone that EMS arrived for patient at 14:00.

## 2015-12-05 NOTE — Progress Notes (Signed)
Patient is alert with confusion, son at bedside in morning, speech evaluated patient and initiated diet, poor appetite, hard of hearing, denies pain, on room air, pt is d/c back to [facility], pt transported via EMS, no new prescriptions, remains incontinent of urine. Uneventful shift.

## 2015-12-05 NOTE — Progress Notes (Signed)
PT Cancellation Note  Patient Details Name: Jay Hughes MRN: [id number] DOB: Oct 11, 1916   Cancelled Treatment:    Reason Eval/Treat Not Completed: Other (comment) (Per discussion with primary MD, patient known to utilize power scooter for primary mobility; no acute PT needs at this time.  Okay to complete order.  Will re-order should needs change.)   Clydean Posas H. Owens Shark, PT, DPT, NCS 12/05/15, 9:53 AM [phone]

## 2015-12-05 NOTE — Evaluation (Signed)
Clinical/Bedside Swallow Evaluation Patient Details  Name: Ronni Derksen MRN: [id number] Date of Birth: Mar 06, 1917  Today's Date: 12/05/2015 Time: SLP Start Time (ACUTE ONLY): 46 SLP Stop Time (ACUTE ONLY): 1020 SLP Time Calculation (min) (ACUTE ONLY): 60 min  Past Medical History:  Past Medical History:  Diagnosis Date  . Hypertension   . 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 No pertinent surgical history. HPI:  Pt is a 82 y.o. male w/ past medical history significant for nasopharyngeal mass and HTN who resides at ALF. Per report, he has been more bedbound in recent time. Pt was mentally alert yesterday but next AM, the patient vomited and became lethargic. He was brought to the ER this AM due to lethargy where w/u was unremarkable except for an elevated troponin. He remained lethargic, unable to provide history. BP is mildly elevated. Pt more awake this morning, verbally responsive but d/t significant HOH status, pt only conversed intermittently. He was able to indicated wants/needs and preferences for foods fed to him at this eval. He followed basic commands but HOH status impeded conversation.    Assessment / Plan / Recommendation Clinical Impression  Pt appeared to adequately tolerate trials of thin liquids via CUP(he helped to hold w/ support) and few trials each of puree/softened solids w/ no immediate, overt s/s of aspiration. Of note, pt exhibited oral phase deficits including increased mastication time and effort of textured foods including eggs. Pt required time and alternating w/ a more moist food such as applesauce and yogurt to fully clear mouth. In addition, pt exhibited minimal anterior/lateral spillage when drinking from Cup. Support given but cup drinking was felt to be safer at this time for pt. Pt was given support/instruction to hold cup to drink and fed self holding his own cup. Pt appears at min increased risk for aspiration seconday to  declined medical status, weakness overall and would benefit from a modified diet and monitoring during meals w/ aspiration precautions to aid safe oral intake. ST consulted MD/NSG re: eval results and poc including Dysphagia 2 diet w/ thin liquids; MD agreed. NSG updated and orders placed.     Aspiration Risk  Mild aspiration risk (reduced following aspiration precautions)    Diet Recommendation  Dysphagia 2 w/ thin liquids via Cup; strict aspiration precautions and assistance at meals - pt can hold cup to drink w/ support.   Medication Administration: Crushed with puree ((pt chewing whole meds this AM w/ NSG); safer intake)    Other  Recommendations Recommended Consults:  (Dietician) Oral Care Recommendations: Oral care BID;Staff/trained caregiver to provide oral care   Follow up Recommendations  None (at this time)    Frequency and Duration            Prognosis Prognosis for Safe Diet Advancement: Fair (-Good) Barriers to Reach Goals:  (weakness)      Swallow Study   General Date of Onset: 12/04/15 HPI: Pt is a 82 y.o. male w/ past medical history significant for nasopharyngeal mass and HTN who resides at ALF. Per report, he has been more bedbound in recent time. Pt was mentally alert yesterday but next AM, the patient vomited and became lethargic. He was brought to the ER this AM due to lethargy where w/u was unremarkable except for an elevated troponin. He remained lethargic, unable to provide history. BP is mildly elevated. Pt more awake this morning, verbally responsive but d/t significant HOH status, pt only conversed intermittently. He was able to indicated wants/needs  and preferences for foods fed to him at this eval. He followed basic commands but HOH status impeded conversation.  Type of Study: Bedside Swallow Evaluation Previous Swallow Assessment: none indicated Diet Prior to this Study:  (unknown - regular?) Temperature Spikes Noted: No (wbc mildly elevated) Respiratory  Status: Room air History of Recent Intubation: No Behavior/Cognition: Alert;Cooperative;Pleasant mood;Requires cueing (HOH) Oral Cavity Assessment: Dry Oral Care Completed by SLP: Yes (attempted) Oral Cavity - Dentition:  (some natural dentition - worn down) Vision: Functional for self-feeding Self-Feeding Abilities: Needs assist;Needs set up (needs support holding cup to drink.) Patient Positioning: Upright in bed (Kyphosis) Baseline Vocal Quality: Normal (gravely) Volitional Cough: Strong Volitional Swallow: Able to elicit    Oral/Motor/Sensory Function Overall Oral Motor/Sensory Function: Within functional limits (grossly but difficult for follow through d/t HOH status)   Ice Chips Ice chips: Within functional limits Presentation: Spoon (fed; 2 trials)   Thin Liquid Thin Liquid: Impaired Presentation: Cup;Self Fed (assisted w/ holding the cup; 3 sets of sipping x4) Oral Phase Impairments: Reduced labial seal Oral Phase Functional Implications: Right anterior spillage;Left anterior spillage Pharyngeal  Phase Impairments:  (none noted;  delayed sneeze post eval)    Nectar Thick Nectar Thick Liquid: Not tested   Honey Thick Honey Thick Liquid: Not tested   Puree Puree: Within functional limits Presentation: Spoon (fed; 6 trials)   Solid   GO   Solid: Impaired (level 2(softened well)) Presentation: Spoon (fed; 3 trials) Oral Phase Impairments: Impaired mastication Oral Phase Functional Implications: Impaired mastication;Oral residue (increased chewing time; slight diffuse residue w/ eggs) Pharyngeal Phase Impairments:  (none noted) Other Comments: alternating foods w/ moist foods(applesauce, yogurt) worked well to clear oral cavity    Functional Assessment Tool Used: clinical judgement Functional Limitations: Swallowing Swallow Current Status [id number]): At least 1 percent but less than 20 percent impaired, limited or restricted Swallow Goal Status [phone]): At least 1 percent but  less than 20 percent impaired, limited or restricted Swallow Discharge Status [phone]): At least 1 percent but less than 20 percent impaired, limited or restricted  Orinda Kenner, MS, CCC-SLP  Aubrii Sharpless 12/05/2015,11:07 AM

## 2015-12-05 NOTE — Clinical Social Work Note (Signed)
Clinical Social Work Assessment  Patient Details  Name: Jay Hughes MRN: [id number] Date of Birth: Jan 29, 1917  Date of referral:  12/05/15               Reason for consult:  Other (Comment Required) (From [facility] )                Permission sought to share information with:  Facility Art therapist granted to share information::  Yes, Verbal Permission Granted  Name::      [facility]   Agency::     Relationship::     Contact Information:     Housing/Transportation Living arrangements for the past 2 months:  [place] of Information:  Adult Children, Facility Patient Interpreter Needed:  None Criminal Activity/Legal Involvement Pertinent to Current Situation/Hospitalization:  No - Comment as needed Significant Relationships:  Adult Children Lives with:  Facility Resident Do you feel safe going back to the place where you live?  Yes Need for family participation in patient care:  Yes (Comment)  Care giving concerns:  Patient is a long term care resident at [facility] in [place], [place].    Social Worker assessment / plan:  Holiday representative (CSW) received verbal consult from MD that patient is from [facility] and is ready for D/C today. Patient uses a power wheel chair at baseline and PT signed off. CSW contacted [facility] administrator who reported that patient can return today and family is requesting EMS for transport. CSW met with patient and made him aware of above. Patient was pleasantly confused. CSW contacted patient's son Jay Hughes. Per son patient has been a private pay resident at [facility] for 10 years. Son is agreeable for patient to return to [place] and requested EMS for transport. CSW explained to son that EMS can be arranged however patient will likely have a co-pay. Son verbalized his understanding. CSW sent D/C orders to [facility]. RN will arrange EMS. Please reconsult if future social work  needs arise. CSW signing off.   Employment status:  Retired Forensic scientist:  Medicare PT Recommendations:  No Follow Up Information / Referral to community resources:  Other (Comment Required) (Patient will return to [facility] )  Patient/Family's Response to care:  Patient and son are agreeable for patient to return to [facility].   Patient/Family's Understanding of and Emotional Response to Diagnosis, Current Treatment, and Prognosis:  Patient and son were pleasant and thanked CSW for visit.   Emotional Assessment Appearance:  Appears stated age Attitude/Demeanor/Rapport:    Affect (typically observed):  Pleasant Orientation:  Oriented to Self, Fluctuating Orientation (Suspected and/or reported Sundowners) Alcohol / Substance use:  Not Applicable Psych involvement (Current and /or in the community):  No (Comment)  Discharge Needs  Concerns to be addressed:  No discharge needs identified Readmission within the last 30 days:  No Current discharge risk:  None Barriers to Discharge:  No Barriers Identified   Jay Hughes, Jay Beets, LCSW 12/05/2015, 12:01 PM

## 2015-12-05 NOTE — Progress Notes (Signed)
Dr. Estanislado Pandy notified of elevated troponin. No new orders at this time.

## 2015-12-06 ENCOUNTER — Emergency Department: Payer: Medicare Other

## 2015-12-06 ENCOUNTER — Encounter: Payer: Self-pay | Admitting: Intensive Care

## 2015-12-06 ENCOUNTER — Emergency Department
Admission: EM | Admit: 2015-12-06 | Discharge: 2015-12-06 | Disposition: A | Discharge status: Home / Self Care | Payer: Medicare Other | Attending: Emergency Medicine | Admitting: Emergency Medicine

## 2015-12-06 DIAGNOSIS — R4182 Altered mental status, unspecified: Secondary | ICD-10-CM | POA: Insufficient documentation

## 2015-12-06 DIAGNOSIS — M1990 Unspecified osteoarthritis, unspecified site: Secondary | ICD-10-CM | POA: Diagnosis not present

## 2015-12-06 DIAGNOSIS — Z87891 Personal history of nicotine dependence: Secondary | ICD-10-CM | POA: Insufficient documentation

## 2015-12-06 DIAGNOSIS — Z7982 Long term (current) use of aspirin: Secondary | ICD-10-CM | POA: Insufficient documentation

## 2015-12-06 DIAGNOSIS — I10 Essential (primary) hypertension: Secondary | ICD-10-CM | POA: Insufficient documentation

## 2015-12-06 DIAGNOSIS — Z79899 Other long term (current) drug therapy: Secondary | ICD-10-CM | POA: Insufficient documentation

## 2015-12-06 DIAGNOSIS — I252 Old myocardial infarction: Secondary | ICD-10-CM | POA: Insufficient documentation

## 2015-12-06 LAB — CBC WITH DIFFERENTIAL/PLATELET
BASOS ABS: 0.1 10*3/uL (ref 0–0.1)
BASOS PCT: 1 %
EOS ABS: 0 10*3/uL (ref 0–0.7)
EOS PCT: 0 %
HCT: 37.7 % — ABNORMAL LOW (ref 40.0–52.0)
Hemoglobin: 13 g/dL (ref 13.0–18.0)
Lymphocytes Relative: 7 %
Lymphs Abs: 0.7 10*3/uL — ABNORMAL LOW (ref 1.0–3.6)
MCH: 29.2 pg (ref 26.0–34.0)
MCHC: 34.4 g/dL (ref 32.0–36.0)
MCV: 84.9 fL (ref 80.0–100.0)
MONO ABS: 0.8 10*3/uL (ref 0.2–1.0)
MONOS PCT: 9 %
NEUTROS ABS: 7.9 10*3/uL — AB (ref 1.4–6.5)
Neutrophils Relative %: 83 %
PLATELETS: 205 10*3/uL (ref 150–440)
RBC: 4.44 MIL/uL (ref 4.40–5.90)
RDW: 15 % — AB (ref 11.5–14.5)
WBC: 9.4 10*3/uL (ref 3.8–10.6)

## 2015-12-06 LAB — COMPREHENSIVE METABOLIC PANEL
ALBUMIN: 3.3 g/dL — AB (ref 3.5–5.0)
ALT: 10 U/L — ABNORMAL LOW (ref 17–63)
ANION GAP: 10 (ref 5–15)
AST: 43 U/L — AB (ref 15–41)
Alkaline Phosphatase: 72 U/L (ref 38–126)
BILIRUBIN TOTAL: 1.1 mg/dL (ref 0.3–1.2)
BUN: 44 mg/dL — AB (ref 6–20)
CHLORIDE: 107 mmol/L (ref 101–111)
CO2: 22 mmol/L (ref 22–32)
Calcium: 8.8 mg/dL — ABNORMAL LOW (ref 8.9–10.3)
Creatinine, Ser: 1.42 mg/dL — ABNORMAL HIGH (ref 0.61–1.24)
GFR calc Af Amer: 46 mL/min — ABNORMAL LOW (ref >60)
GFR calc non Af Amer: 40 mL/min — ABNORMAL LOW (ref >60)
GLUCOSE: 104 mg/dL — AB (ref 65–99)
POTASSIUM: 4.4 mmol/L (ref 3.5–5.1)
SODIUM: 139 mmol/L (ref 135–145)
TOTAL PROTEIN: 7.2 g/dL (ref 6.5–8.1)

## 2015-12-06 LAB — TROPONIN I: Troponin I: 0.34 ng/mL (ref <0.03)

## 2015-12-06 LAB — PHENYTOIN LEVEL, TOTAL

## 2015-12-06 LAB — LIPASE, BLOOD: Lipase: 13 U/L (ref 11–51)

## 2015-12-06 LAB — TSH: TSH: 1.295 u[IU]/mL (ref 0.350–4.500)

## 2015-12-06 MED ORDER — SODIUM CHLORIDE 0.9 % IV BOLUS (SEPSIS): 1000.0000 mL | Freq: Once | INTRAVENOUS | Status: DC

## 2015-12-06 MED ORDER — LORAZEPAM 2 MG/ML IJ SOLN
1.0000 mg | Freq: Once | INTRAMUSCULAR | Status: AC
  Administered 2015-12-06: 1 mg via INTRAVENOUS
  Filled 2015-12-06: qty 1

## 2015-12-06 NOTE — ED Notes (Signed)
MD at bedside. 

## 2015-12-06 NOTE — ED Triage Notes (Signed)
Patient arrived by EMS from [facility]. Patient was admitted to [facility] for altered mental status and d/c yesterday back to [facility]. Staff sent patient back to ER due to patient not responding to them or acting himself. Staff reports patient is usually up in wheelchair pushing himself around and verbal with staff. Upon arrival to ER patient is non verbal.

## 2015-12-06 NOTE — ED Provider Notes (Signed)
[facility] Emergency Department Provider Note   ____________________________________________  Time seen: Seen upon arrival to the emergency department  I have reviewed the triage vital signs and the nursing notes.   HISTORY  Chief Complaint Altered Mental Status   HPI Jay Hughes is a 82 y.o. male with a history of hypertension who is presenting to the emergency department today with altered mental status. He was just discharged a hospital yesterday after being diagnosed with symptomatically dehydration. His son is at the bedside and says that he is normally talkative and is able to operate his motor scooter on his own. However, the son does say that upon discharge yesterday from the hospital that the patient was not back to his baseline. The son also says that today he is worse than he has seen him over the past several days. The patient has a DO NOT RESUSCITATE order.Upon initial examination of the patient's son was not there so a panel of labs was ordered.  Also, in route with EMS the patient was found to be desaturating on room air to the low 90s. He was placed on nasal cannula oxygen which resolved his oxygen to the high 90s. Patient is on hydrocodone but staff at the skilled nursing facility reported to EMS that he did not get any hydrocodone today.  Past Medical History:  Diagnosis Date  . Hypertension   . 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 Lethargy 12/04/2015  . Nausea & vomiting 12/04/2015  . Elevated troponin 12/04/2015  . HTN, goal below 140/80 12/04/2015  . Pressure ulcer 12/04/2015  . HTN (hypertension) 03/13/2015  . Chest pain 03/13/2015  . NSTEMI (non-ST elevated myocardial infarction) ([place]) 03/13/2015  . 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 Arthritis 10/12/2014  . BP (high blood pressure) 10/12/2014  . HLD (hyperlipidemia) 10/12/2014  . Leg weakness 10/12/2014    Past Surgical  History:  Procedure Laterality Date  . BACK SURGERY    . REPLACEMENT TOTAL KNEE Left     Current Outpatient Rx  . Order #: [id number] Class: Historical Med  . Order #: [id number] Class: Historical Med  . Order #: [id number] Class: Historical Med  . Order #: [id number] Class: Normal  . Order #: [id number] Class: Historical Med  . Order #: [id number] Class: Historical Med  . Order #: [id number] Class: Normal  . Order #: [id number] Class: Historical Med  . Order #: [id number] Class: Normal  . Order #: [id number] Class: Historical Med  . Order #: [id number] Class: Historical Med  . Order #: [id number] Class: Historical Med  . Order #: [id number] Class: Historical Med  . Order #: [id number] Class: Historical Med    Allergies Review of patient's allergies indicates no known allergies.  No family history on file.  Social History Social History  Substance Use Topics  . Smoking status: Former Research scientist (life sciences)  . Smokeless tobacco: Never Used  . Alcohol use No    Review of Systems Level V caveat secondary to altered mental status. ____________________________________________   PHYSICAL EXAM:  VITAL SIGNS: ED Triage Vitals [12/06/15 1535]  Enc Vitals Group     BP (!) 184/86     Pulse Rate 80     Resp (!) 22     Temp 99.2 F (37.3 C)     Temp Source Rectal     SpO2 96 %     Weight 180 lb (81.6 kg)     Height      Head Circumference      Peak Flow  Pain Score      Pain Loc      Pain Edu?      Excl. in [place]?     Constitutional: Ill-appearing with snoring respirations. Eyes: Conjunctivae are normal. PERRL with pupils 2-39mm.  Head: Atraumatic. Nose: No congestion/rhinnorhea. Mouth/Throat: Mucous membranes are dry Neck: No stridor.   Cardiovascular: Normal rate, regular rhythm. Grossly normal heart sounds.   Respiratory: Snoring respirations.  No retractions. Lungs CTAB. Gastrointestinal: Soft. No distention.  Musculoskeletal: Mild bilateral lower extremity edema. Neurologic: Patient with  movement of his right arm with some purposeful appearing movement but is not responding to commands. Skin:  Skin is warm but with decreased turgor.  ____________________________________________   LABS (all labs ordered are listed, but only abnormal results are displayed)  Labs Reviewed  CBC WITH DIFFERENTIAL/PLATELET - Abnormal; Notable for the following:       Result Value   HCT 37.7 (*)    RDW 15.0 (*)    Neutro Abs 7.9 (*)    Lymphs Abs 0.7 (*)    All other components within normal limits  COMPREHENSIVE METABOLIC PANEL - Abnormal; Notable for the following:    Glucose, Bld 104 (*)    BUN 44 (*)    Creatinine, Ser 1.42 (*)    Calcium 8.8 (*)    Albumin 3.3 (*)    AST 43 (*)    ALT 10 (*)    GFR calc non Af Amer 40 (*)    GFR calc Af Amer 46 (*)    All other components within normal limits  TROPONIN I - Abnormal; Notable for the following:    Troponin I 0.34 (*)    All other components within normal limits  LIPASE, BLOOD  URINALYSIS COMPLETEWITH MICROSCOPIC ([facility] ONLY)  PHENYTOIN LEVEL, TOTAL  TSH   ____________________________________________  EKG   ____________________________________________  RADIOLOGY  CLINICAL DATA:  Unresponsive.  Hypoxia. EXAM: CHEST 1 VIEW COMPARISON:  One-view chest x-ray 12/05/2015. FINDINGS: The heart is enlarged. Extensive atherosclerotic calcifications are present at the aortic arch. Bilateral interstitial coarsening is again noted. Bibasilar airspace disease likely reflects atelectasis. There is no significant interval change. 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 IMPRESSION: 1. No acute cardiopulmonary disease. 2. Low lung volumes. 3. Cardiomegaly without failure. 4. Atherosclerosis. Electronically Signed   By: San Morelle M.D.   On: 12/06/2015  16:01 ____________________________________________   PROCEDURES   Procedures  ____________________________________________   INITIAL IMPRESSION / ASSESSMENT AND PLAN / ED COURSE  Pertinent labs & imaging results that were available during my care of the patient were reviewed by me and considered in my medical decision making (see chart for details).  ----------------------------------------- 4:19 PM on 12/06/2015 -----------------------------------------  His custom with the patient's son who is also power of attorney and the son would like the patient on hospice at this time. He says that since he just had an extensive workup that he would not like the patient worked up extensively again so soon. He says that the patient was also given the point where he was refusing some medical care such as seeing the dermatologist for lesions on his head. The sinus thing that he does not think that the patient would like an extensive workup at this time. I did offer him Foley catheterization but the son does not want this intervention. At this point we discussed possible options including hospice. I discussed with the Hospital liaison who will be down to see the son  in the room.  Clinical Course   ----------------------------------------- 5:17 PM on 12/06/2015 -----------------------------------------  Patient and family seen by hospice liaison. The decision has been made to transfer the patient to the hospice house. Patient has been accepted to hospice house. Patient suddenly agitated at this time. We'll give Ativan.  ____________________________________________   FINAL CLINICAL IMPRESSION(S) / ED DIAGNOSES  Altered mental status.    NEW MEDICATIONS STARTED DURING THIS VISIT:  New Prescriptions   No medications on file     Note:  This document was prepared using Dragon voice recognition software and may include unintentional dictation errors.    Orbie Pyo,  MD 12/06/15 [phone]

## 2015-12-06 NOTE — ED Notes (Signed)
Contacting hospice to come speak with family

## 2015-12-06 NOTE — ED Notes (Signed)
Hospice RN asked this RN to take patient off the monitor and unhook from our cords to make patient more comfortable. MD made aware and approved. Waiting on EMS to come transport patient to hospice home

## 2015-12-28 LAB — CULTURE, BLOOD (ROUTINE X 2)
CULTURE: NO GROWTH
CULTURE: NO GROWTH

## 2016-01-13 DEATH — deceased
